# Patient Record
Sex: Male | Born: 1950 | Race: White | Hispanic: No | Marital: Married | Smoking: Former smoker
Health system: Southern US, Community
[De-identification: ages and names within clinical notes are randomized; demographics above are authoritative.]

## PROBLEM LIST (undated history)

## (undated) DIAGNOSIS — IMO0001 Reserved for inherently not codable concepts without codable children: Secondary | ICD-10-CM

## (undated) DIAGNOSIS — N529 Male erectile dysfunction, unspecified: Secondary | ICD-10-CM

## (undated) DIAGNOSIS — I517 Cardiomegaly: Secondary | ICD-10-CM

## (undated) DIAGNOSIS — Z0000 Encounter for general adult medical examination without abnormal findings: Secondary | ICD-10-CM

## (undated) DIAGNOSIS — N200 Calculus of kidney: Secondary | ICD-10-CM

## (undated) DIAGNOSIS — N320 Bladder-neck obstruction: Secondary | ICD-10-CM

## (undated) DIAGNOSIS — D649 Anemia, unspecified: Secondary | ICD-10-CM

## (undated) DIAGNOSIS — R310 Gross hematuria: Secondary | ICD-10-CM

## (undated) DIAGNOSIS — N400 Enlarged prostate without lower urinary tract symptoms: Secondary | ICD-10-CM

## (undated) DIAGNOSIS — N138 Other obstructive and reflux uropathy: Secondary | ICD-10-CM

## (undated) DIAGNOSIS — M1990 Unspecified osteoarthritis, unspecified site: Secondary | ICD-10-CM

## (undated) DIAGNOSIS — N401 Enlarged prostate with lower urinary tract symptoms: Principal | ICD-10-CM

## (undated) DIAGNOSIS — I10 Essential (primary) hypertension: Secondary | ICD-10-CM

## (undated) HISTORY — PX: LUMBAR LAMINECTOMY: SHX95

## (undated) HISTORY — DX: Benign prostatic hyperplasia without lower urinary tract symptoms: N40.0

## (undated) HISTORY — DX: Reserved for inherently not codable concepts without codable children: IMO0001

## (undated) HISTORY — DX: Unspecified osteoarthritis, unspecified site: M19.90

## (undated) HISTORY — DX: Encounter for general adult medical examination without abnormal findings: Z00.00

## (undated) HISTORY — DX: Benign prostatic hyperplasia with lower urinary tract symptoms: N40.1

## (undated) HISTORY — DX: Other obstructive and reflux uropathy: N13.8

## (undated) HISTORY — DX: Essential (primary) hypertension: I10

## (undated) HISTORY — DX: Cardiomegaly: I51.7

## (undated) HISTORY — PX: OTHER SURGICAL HISTORY: SHX169

## (undated) HISTORY — DX: Male erectile dysfunction, unspecified: N52.9

## (undated) HISTORY — DX: Calculus of kidney: N20.0

## (undated) HISTORY — DX: Gross hematuria: R31.0

## (undated) HISTORY — DX: Bladder-neck obstruction: N32.0

## (undated) HISTORY — DX: Anemia, unspecified: D64.9

## (undated) HISTORY — PX: MELANOMA EXCISION: SHX5266

---

## 2005-08-01 ENCOUNTER — Ambulatory Visit: Payer: Self-pay | Admitting: Gastroenterology

## 2009-01-29 ENCOUNTER — Ambulatory Visit: Payer: Self-pay | Admitting: Unknown Physician Specialty

## 2009-04-19 ENCOUNTER — Ambulatory Visit: Payer: Self-pay | Admitting: Specialist

## 2011-07-10 ENCOUNTER — Emergency Department: Payer: Self-pay | Admitting: Emergency Medicine

## 2011-07-10 LAB — COMPREHENSIVE METABOLIC PANEL
Anion Gap: 7 (ref 7–16)
Calcium, Total: 9 mg/dL (ref 8.5–10.1)
Chloride: 104 mmol/L (ref 98–107)
Co2: 28 mmol/L (ref 21–32)
EGFR (Non-African Amer.): >60
Osmolality: 280 (ref 275–301)
Potassium: 4.7 mmol/L (ref 3.5–5.1)
Sodium: 139 mmol/L (ref 136–145)

## 2011-07-10 LAB — CBC
HCT: 42.6 % (ref 40.0–52.0)
HGB: 14.1 g/dL (ref 13.0–18.0)
MCH: 26.5 pg (ref 26.0–34.0)
MCV: 80 fL (ref 80–100)
RDW: 14.6 % — ABNORMAL HIGH (ref 11.5–14.5)
WBC: 6.8 10*3/uL (ref 3.8–10.6)

## 2011-07-10 LAB — URINALYSIS, COMPLETE
Bacteria: NONE SEEN
Bilirubin,UR: NEGATIVE
Blood: NEGATIVE
Glucose,UR: NEGATIVE mg/dL (ref 0–75)
Ph: 6 (ref 4.5–8.0)
Specific Gravity: 1.006 (ref 1.003–1.030)
Squamous Epithelial: <1

## 2011-07-10 LAB — CK TOTAL AND CKMB (NOT AT ARMC)
CK, Total: 108 U/L (ref 35–232)
CK-MB: 1.2 ng/mL (ref 0.5–3.6)

## 2011-07-10 LAB — TSH: Thyroid Stimulating Horm: 2.86 u[IU]/mL

## 2011-08-29 ENCOUNTER — Ambulatory Visit: Payer: Self-pay | Admitting: Urology

## 2012-01-17 ENCOUNTER — Ambulatory Visit: Payer: Self-pay | Admitting: Internal Medicine

## 2012-06-26 ENCOUNTER — Other Ambulatory Visit: Payer: Self-pay

## 2012-11-11 ENCOUNTER — Ambulatory Visit: Payer: Self-pay | Admitting: Urology

## 2013-08-02 DIAGNOSIS — N138 Other obstructive and reflux uropathy: Secondary | ICD-10-CM

## 2013-08-02 DIAGNOSIS — N401 Enlarged prostate with lower urinary tract symptoms: Principal | ICD-10-CM

## 2013-08-02 DIAGNOSIS — I517 Cardiomegaly: Secondary | ICD-10-CM

## 2013-08-02 DIAGNOSIS — M1990 Unspecified osteoarthritis, unspecified site: Secondary | ICD-10-CM

## 2013-08-02 HISTORY — DX: Unspecified osteoarthritis, unspecified site: M19.90

## 2013-08-02 HISTORY — DX: Cardiomegaly: I51.7

## 2013-08-02 HISTORY — DX: Other obstructive and reflux uropathy: N13.8

## 2014-04-29 ENCOUNTER — Ambulatory Visit
Admit: 2014-04-29 | Disposition: A | Discharge status: Home / Self Care | Payer: Self-pay | Attending: Urology | Admitting: Urology

## 2014-06-08 ENCOUNTER — Other Ambulatory Visit: Payer: Self-pay | Admitting: Urology

## 2014-06-08 DIAGNOSIS — N400 Enlarged prostate without lower urinary tract symptoms: Secondary | ICD-10-CM

## 2014-06-08 MED ORDER — FINASTERIDE 5 MG PO TABS: 5.0000 mg | ORAL_TABLET | Freq: Every day | ORAL | Status: DC

## 2014-09-11 DIAGNOSIS — Z0000 Encounter for general adult medical examination without abnormal findings: Secondary | ICD-10-CM

## 2014-09-11 HISTORY — DX: Encounter for general adult medical examination without abnormal findings: Z00.00

## 2014-11-24 ENCOUNTER — Encounter: Payer: Self-pay | Admitting: Urology

## 2014-11-24 ENCOUNTER — Ambulatory Visit (INDEPENDENT_AMBULATORY_CARE_PROVIDER_SITE_OTHER): Payer: BC Managed Care – PPO | Admitting: Urology

## 2014-11-24 VITALS — BP 147/88 | HR 59 | Ht 71.0 in | Wt 166.7 lb

## 2014-11-24 DIAGNOSIS — N401 Enlarged prostate with lower urinary tract symptoms: Secondary | ICD-10-CM

## 2014-11-24 DIAGNOSIS — N5201 Erectile dysfunction due to arterial insufficiency: Secondary | ICD-10-CM

## 2014-11-24 DIAGNOSIS — N138 Other obstructive and reflux uropathy: Secondary | ICD-10-CM

## 2014-11-24 DIAGNOSIS — R310 Gross hematuria: Secondary | ICD-10-CM

## 2014-11-24 DIAGNOSIS — Z125 Encounter for screening for malignant neoplasm of prostate: Secondary | ICD-10-CM

## 2014-11-24 DIAGNOSIS — N529 Male erectile dysfunction, unspecified: Secondary | ICD-10-CM | POA: Insufficient documentation

## 2014-11-24 LAB — URINALYSIS, COMPLETE
Bilirubin, UA: NEGATIVE
Glucose, UA: NEGATIVE
Ketones, UA: NEGATIVE
LEUKOCYTES UA: NEGATIVE
Nitrite, UA: NEGATIVE
PH UA: 6 (ref 5.0–7.5)
Protein, UA: NEGATIVE
RBC, UA: NEGATIVE
Specific Gravity, UA: 1.02 (ref 1.005–1.030)
Urobilinogen, Ur: 0.2 mg/dL (ref 0.2–1.0)

## 2014-11-24 LAB — MICROSCOPIC EXAMINATION
BACTERIA UA: NONE SEEN
EPITHELIAL CELLS (NON RENAL): NONE SEEN /HPF (ref 0–10)
RBC MICROSCOPIC, UA: NONE SEEN /HPF (ref 0–?)

## 2014-11-24 LAB — BLADDER SCAN AMB NON-IMAGING

## 2014-11-24 NOTE — Progress Notes (Signed)
Bladder Scan Patient void: 53 ml Performed By: K.Russell,CMA 

## 2014-11-24 NOTE — Progress Notes (Signed)
11/24/2014 8:40 AM   Dionisio Paschal 28-Aug-1950 [id number]  Referring provider: No referring provider defined for this encounter.  Chief Complaint  Patient presents with  . Benign Prostatic Hypertrophy    uroflow, IPSS, PVR    HPI: 1 - Enlarged Prostate With Urinary hesitancy / Weak Stream - Long h/o mix of obsturctive and iritaitve sympotms. No on finasteride and tamsulosin with signifciant improvement, overal satisfied.   2 - Prostate Screening - No FHX prostate cancer 11/2014 DRE 40gm smooth/ PSA (1.97 (pre finasteride)  3 - Gross hematuria - eval 07/2014 with large prostate as likely cause, No masses.   4 - Erectile Dysfunction - long h/o difficulty achiieng and maintianing. Tride PDE5i but did not tollerate systemic symptoms. Libido preserved. Interested in trimix.   Today " Jeff Spencer" is seen in f/u above and PVR. No interval hematuria or urinary retention.    PMH: No past medical history on file.  Surgical History: No past surgical history on file.  Home Medications:    Medication List       This list is accurate as of: 11/24/14  8:40 AM.  Always use your most recent med list.               finasteride 5 MG tablet  Commonly known as:  PROSCAR  Take 1 tablet (5 mg total) by mouth daily.     naproxen 500 MG tablet  Commonly known as:  NAPROSYN  Take by mouth.     pantoprazole 40 MG tablet  Commonly known as:  PROTONIX  Take by mouth.     tamsulosin 0.4 MG Caps capsule  Commonly known as:  FLOMAX  TAKE ONE CAPSULE BY MOUTH EVERY DAY TAKE 30 MINUTES AFTER SAME MEAL EACH DAY        Allergies:  Allergies  Allergen Reactions  . Penicillin V Potassium Other (See Comments)  . Sulfa Antibiotics     Other reaction(s): Unknown    Family History: No family history on file.  Social History:  has no tobacco, alcohol, and drug history on file.  ROS:    Review of Systems  Gastrointestinal (upper)  : Negative for upper GI symptoms  Gastrointestinal  (lower) : Negative for lower GI symptoms  Constitutional : Negative for symptoms  Skin: Negative for skin symptoms  Eyes: Negative for eye symptoms  Ear/Nose/Throat : Negative for Ear/Nose/Throat symptoms  Hematologic/Lymphatic: Negative for Hematologic/Lymphatic symptoms  Cardiovascular : Negative for cardiovascular symptoms  Respiratory : Negative for respiratory symptoms  Endocrine: Negative for endocrine symptoms  Musculoskeletal: Negative for musculoskeletal symptoms  Neurological: Negative for neurological symptoms  Psychologic: Negative for psychiatric symptoms     Physical Exam: There were no vitals taken for this visit.  Constitutional:  Alert and oriented, No acute distress. HEENT: [place] AT, moist mucus membranes.  Trachea midline, no masses. Cardiovascular: No clubbing, cyanosis, or edema. Respiratory: Normal respiratory effort, no increased work of breathing. GI: Abdomen is soft, nontender, nondistended, no abdominal masses GU: No CVA tenderness. Phallus straight. No testes masses. DRE 40gm smooth Skin: No rashes, bruises or suspicious lesions. Lymph: No cervical or inguinal adenopathy. Neurologic: Grossly intact, no focal deficits, moving all 4 extremities. Psychiatric: Normal mood and affect.  Laboratory Data: Lab Results  Component Value Date   WBC 6.8 07/10/2011   HGB 14.1 07/10/2011   HCT 42.6 07/10/2011   MCV 80 07/10/2011   PLT 236 07/10/2011    Lab Results  Component Value Date   CREATININE 1.08  07/10/2011    No results found for: PSA  No results found for: TESTOSTERONE  No results found for: HGBA1C  Urinalysis    Component Value Date/Time   COLORURINE Straw 07/10/2011 1234   APPEARANCEUR Clear 07/10/2011 1234   LABSPEC 1.006 07/10/2011 1234   PHURINE 6.0 07/10/2011 1234   GLUCOSEU Negative 07/10/2011 1234   HGBUR Negative 07/10/2011 1234   BILIRUBINUR Negative 07/10/2011 1234   KETONESUR Negative 07/10/2011 1234    PROTEINUR Negative 07/10/2011 1234   NITRITE Negative 07/10/2011 1234   LEUKOCYTESUR Negative 07/10/2011 1234    Pertinent Imaging:  PVR " 56 mL"  Assessment & Plan:   1 - Enlarged Prostate With Urinary hesitancy / Weak Stream - Good response to  finasteride and tamsulosin, continue.   2 - Prostate Screening - up to date this year, continue annual screening.    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 Would need repeat eval for futrue gross episodes onliy.   4 - Erectile Dysfunction - discussed trimix, VED, MUSE, IPP and he wants to try trimix. RX and directions to custom care given.  5 - RTC few weeks for trimix teaching, then annualy.     No Follow-up on file.  Sebastian AcheMANNY, Daveena Elmore, MD  [facility] [address], [address] [place], [place] [address] [phone]

## 2014-12-07 ENCOUNTER — Telehealth: Payer: Self-pay | Admitting: Urology

## 2014-12-07 NOTE — Telephone Encounter (Signed)
Pharmacy called.  Pt went in to get prescription for Tri mix that was written on 11/24/14.  Pharmacist had a question, said Dr. Berneice HeinrichManny didn't write prescription correctly.  Please call [phone] dial ext [phone].

## 2014-12-08 NOTE — Telephone Encounter (Signed)
[facility] to [facility] Dr Emmaline LifeManny's Rx for TriMix.  I also LVM at patients home # letting him know that his Rx is now ready to be picked up.

## 2014-12-15 ENCOUNTER — Encounter: Payer: Self-pay | Admitting: Urology

## 2014-12-15 ENCOUNTER — Ambulatory Visit (INDEPENDENT_AMBULATORY_CARE_PROVIDER_SITE_OTHER): Payer: BC Managed Care – PPO | Admitting: Urology

## 2014-12-15 VITALS — BP 146/77 | HR 57 | Ht 71.0 in | Wt 167.4 lb

## 2014-12-15 DIAGNOSIS — N401 Enlarged prostate with lower urinary tract symptoms: Secondary | ICD-10-CM

## 2014-12-15 DIAGNOSIS — N5201 Erectile dysfunction due to arterial insufficiency: Secondary | ICD-10-CM

## 2014-12-15 DIAGNOSIS — R310 Gross hematuria: Secondary | ICD-10-CM | POA: Diagnosis not present

## 2014-12-15 DIAGNOSIS — N138 Other obstructive and reflux uropathy: Secondary | ICD-10-CM

## 2014-12-15 NOTE — Progress Notes (Signed)
8:36 AM   Dionisio PaschalDanny L Schimming [birth year] [id number]  Referring provider: Lauro RegulusMarshall W Anderson, MD [address] [facility] [place], [place] [address]  Chief Complaint  Patient presents with  . Follow-up    pt comes in today for a trimix injection and instructions on how to inject    HPI: 1 - Enlarged Prostate With Urinary hesitancy / Weak Stream - Long h/o mix of obsturctive and iritaitve sympotms. No on finasteride and tamsulosin with signifciant improvement, overal satisfied. PVR on meds "56mL" 2016.    2 - Prostate Screening - No FHX prostate cancer 11/2014 DRE 40gm smooth/ PSA (1.97 (pre finasteride)  3 - Gross hematuria - eval 07/2014 with large prostate as likely cause, No masses.   4 - Erectile Dysfunction - long h/o difficulty achiieng and maintianing. Tride PDE5i but did not tollerate systemic symptoms. Libido preserved. Trimix teachig performed 12/2014.   Today " Jeff Spencer" is seen to proceed with trimix teaching.    PMH: Past Medical History  Diagnosis Date  . Hemoglobin low   . Bladder neck contracture   . Kidney stone on left side   . Gross hematuria   . BPH (benign prostatic hyperplasia)   . Hypertension   . Frequency   . ED (erectile dysfunction)   . Left ventricular hypertrophy 08/02/2013    Last Assessment & Plan:  Bp is controlled and not sob.    . Arthritis, degenerative 08/02/2013  . Benign prostatic hyperplasia with urinary obstruction 08/02/2013    Last Assessment & Plan:  Urinary symptoms are doing well on flomax.    . Encounter for general adult medical examination without abnormal findings 09/11/2014    Overview:  Colonoscopy 07 and normal. Zostavax rx 2015, pneumovax 2016     Surgical History: Past Surgical History  Procedure Laterality Date  . Melanoma excision    . Lumbar laminectomy    . Pheochromocytoma excision      Home Medications:    Medication List       This list is accurate as of: 12/15/14  8:36 AM.  Always use your  most recent med list.               finasteride 5 MG tablet  Commonly known as:  PROSCAR  Take 1 tablet (5 mg total) by mouth daily.     naproxen 500 MG tablet  Commonly known as:  NAPROSYN  Take by mouth.     pantoprazole 40 MG tablet  Commonly known as:  PROTONIX  Take by mouth.     tamsulosin 0.4 MG Caps capsule  Commonly known as:  FLOMAX  TAKE ONE CAPSULE BY MOUTH EVERY DAY TAKE 30 MINUTES AFTER SAME MEAL EACH DAY        Allergies:  Allergies  Allergen Reactions  . Hydrocodone Hives  . Penicillin V Potassium Other (See Comments)  . Sulfa Antibiotics     Other reaction(s): Unknown    Family History: Family History  Problem Relation Age of Onset  . Kidney disease Neg Hx   . Aneurysm Father   . Bladder Cancer Neg Hx   . Prostate cancer Paternal Grandfather   . Heart disease Father   . Diabetes Mother   . Diabetes Father     Social History:  reports that he has quit smoking. He does not have any smokeless tobacco history on file. He reports that he drinks alcohol. He reports that he does not use illicit drugs.  ROS:    Review of Systems  Gastrointestinal (upper)  : Negative for upper GI symptoms  Gastrointestinal (lower) : Negative for lower GI symptoms  Constitutional : Negative for symptoms  Skin: Negative for skin symptoms  Eyes: Negative for eye symptoms  Ear/Nose/Throat : Negative for Ear/Nose/Throat symptoms  Hematologic/Lymphatic: Negative for Hematologic/Lymphatic symptoms  Cardiovascular : Negative for cardiovascular symptoms  Respiratory : Negative for respiratory symptoms  Endocrine: Negative for endocrine symptoms  Musculoskeletal: Negative for musculoskeletal symptoms  Neurological: Negative for neurological symptoms  Psychologic: Negative for psychiatric symptoms     Physical Exam: There were no vitals taken for this visit.  Constitutional:  Alert and oriented, No acute distress. HEENT: [place] AT, moist mucus  membranes.  Trachea midline, no masses. Cardiovascular: No clubbing, cyanosis, or edema. Respiratory: Normal respiratory effort, no increased work of breathing. GI: Abdomen is soft, nontender, nondistended, no abdominal masses GU: No CVA tenderness. Phallus straight. No testes masses. DRE 40gm smooth Skin: No rashes, bruises or suspicious lesions. Lymph: No cervical or inguinal adenopathy. Neurologic: Grossly intact, no focal deficits, moving all 4 extremities. Psychiatric: Normal mood and affect.  Laboratory Data: Lab Results  Component Value Date   WBC 6.8 07/10/2011   HGB 14.1 07/10/2011   HCT 42.6 07/10/2011   MCV 80 07/10/2011   PLT 236 07/10/2011    Lab Results  Component Value Date   CREATININE 1.08 07/10/2011    No results found for: PSA  No results found for: TESTOSTERONE  No results found for: HGBA1C  Urinalysis    Component Value Date/Time   COLORURINE Straw 07/10/2011 1234   APPEARANCEUR Clear 07/10/2011 1234   LABSPEC 1.006 07/10/2011 1234   PHURINE 6.0 07/10/2011 1234   GLUCOSEU Negative 11/24/2014 0849   GLUCOSEU Negative 07/10/2011 1234   HGBUR Negative 07/10/2011 1234   BILIRUBINUR Negative 11/24/2014 0849   BILIRUBINUR Negative 07/10/2011 1234   KETONESUR Negative 07/10/2011 1234   PROTEINUR Negative 07/10/2011 1234   NITRITE Negative 11/24/2014 0849   NITRITE Negative 07/10/2011 1234   LEUKOCYTESUR Negative 11/24/2014 0849   LEUKOCYTESUR Negative 07/10/2011 1234    CORPORAL BODY INJECTION PROCEDURE:  We discussed physiology of trimix, injection technique, proper storage, alternating injeciton sites. We discussed dose-titration phase and risk of priapism and need to go to ER / call MD for painful erection lasting longer than 4 or more hours.   I then administered 0.2cc of trimix into penis using aseptic technique. I rechecked the patient 10 minutes later and he has proper response w/o hematoma.    Assessment & Plan:   1 - Enlarged Prostate  With Urinary hesitancy / Weak Stream - Good response to  finasteride and tamsulosin, continue.   2 - Prostate Screening - up to date this year, continue annual screening.    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 Would need repeat eval for futrue gross episodes onliy.   4 - Erectile Dysfunction - s/p trimix teaching as per above.   5 - RTC 1 year with PSA prior.     No Follow-up on file.  Sebastian Ache, MD  [facility] [address] [place], [place] [address] [phone]

## 2015-05-26 DIAGNOSIS — H02834 Dermatochalasis of left upper eyelid: Secondary | ICD-10-CM | POA: Insufficient documentation

## 2015-05-26 DIAGNOSIS — H02836 Dermatochalasis of left eye, unspecified eyelid: Secondary | ICD-10-CM

## 2015-05-26 DIAGNOSIS — H57819 Brow ptosis, unspecified: Secondary | ICD-10-CM | POA: Insufficient documentation

## 2015-05-26 DIAGNOSIS — H02833 Dermatochalasis of right eye, unspecified eyelid: Secondary | ICD-10-CM | POA: Insufficient documentation

## 2015-12-14 ENCOUNTER — Ambulatory Visit (INDEPENDENT_AMBULATORY_CARE_PROVIDER_SITE_OTHER): Payer: BC Managed Care – PPO | Admitting: Urology

## 2015-12-14 ENCOUNTER — Encounter: Payer: Self-pay | Admitting: Urology

## 2015-12-14 VITALS — BP 130/83 | HR 59 | Ht 70.0 in | Wt 173.0 lb

## 2015-12-14 DIAGNOSIS — N5201 Erectile dysfunction due to arterial insufficiency: Secondary | ICD-10-CM | POA: Diagnosis not present

## 2015-12-14 DIAGNOSIS — N486 Induration penis plastica: Secondary | ICD-10-CM | POA: Diagnosis not present

## 2015-12-14 DIAGNOSIS — R350 Frequency of micturition: Secondary | ICD-10-CM | POA: Diagnosis not present

## 2015-12-14 DIAGNOSIS — N138 Other obstructive and reflux uropathy: Secondary | ICD-10-CM

## 2015-12-14 DIAGNOSIS — N401 Enlarged prostate with lower urinary tract symptoms: Secondary | ICD-10-CM | POA: Diagnosis not present

## 2015-12-14 DIAGNOSIS — R310 Gross hematuria: Secondary | ICD-10-CM

## 2015-12-14 LAB — URINALYSIS, COMPLETE
BILIRUBIN UA: NEGATIVE
GLUCOSE, UA: NEGATIVE
Ketones, UA: NEGATIVE
Leukocytes, UA: NEGATIVE
Nitrite, UA: NEGATIVE
RBC, UA: NEGATIVE
SPEC GRAV UA: 1.02 (ref 1.005–1.030)
Urobilinogen, Ur: 0.2 mg/dL (ref 0.2–1.0)
pH, UA: 7 (ref 5.0–7.5)

## 2015-12-14 LAB — MICROSCOPIC EXAMINATION: BACTERIA UA: NONE SEEN

## 2015-12-14 MED ORDER — FINASTERIDE 5 MG PO TABS: 5.0000 mg | ORAL_TABLET | Freq: Every day | ORAL | 3 refills | Status: DC

## 2015-12-14 MED ORDER — TAMSULOSIN HCL 0.4 MG PO CAPS: 0.4000 mg | ORAL_CAPSULE | Freq: Every day | ORAL | 3 refills | Status: DC

## 2015-12-14 NOTE — Progress Notes (Signed)
5:46 AM   Dionisio PaschalDanny L Banet [birth year] [id number]  Referring provider: Lauro RegulusMarshall W Anderson, MD [address] [facility] [place] [place] [address]  No chief complaint on file.   HPI: 1 - Enlarged Prostate With Urinary hesitancy / Weak Stream - Long h/o mix of obsturctive and iritaitve sympotms. No on finasteride and tamsulosin with signifciant improvement, overal satisfied. PVR on meds "56mL" 2016.    2 - Prostate Screening - No FHX prostate cancer 11/2014 DRE 40gm smooth/ PSA (1.97 (pre finasteride) 12/2015 DRE 40gm smooth / PSA (today / pending on finasteride)  3 - Gross hematuria - eval 07/2014 with large prostate as likely cause, No masses.   4 - Erectile Dysfunction - long h/o difficulty achiieng and maintianing. Tride PDE5i but did not tollerate systemic symptoms. Libido preserved. Trimix teachig performed 12/2014. Meets goals but admits to infreqeunt use.   5 - Peyronie's Disease - abotu 45 degrees dorsal curvature stable x years. Able to penetrate but with some difficulty mostly from hinging.   Today " Jeff Spencer" is seen in f/u above. No interval gross hematuria.    PMH: Past Medical History:  Diagnosis Date  . Arthritis, degenerative 08/02/2013  . Benign prostatic hyperplasia with urinary obstruction 08/02/2013   Last Assessment & Plan:  Urinary symptoms are doing well on flomax.    . Bladder neck contracture   . BPH (benign prostatic hyperplasia)   . ED (erectile dysfunction)   . Encounter for general adult medical examination without abnormal findings 09/11/2014   Overview:  Colonoscopy 07 and normal. Zostavax rx 2015, pneumovax 2016   . Frequency   . Gross hematuria   . Hemoglobin low   . Hypertension   . Kidney stone on left side   . Left ventricular hypertrophy 08/02/2013   Last Assessment & Plan:  Bp is controlled and not sob.      Surgical History: Past Surgical History:  Procedure Laterality Date  . LUMBAR LAMINECTOMY    . MELANOMA EXCISION      . Pheochromocytoma Excision      Home Medications:    Medication List       Accurate as of 12/14/15  5:46 AM. Always use your most recent med list.          finasteride 5 MG tablet Commonly known as:  PROSCAR Take 1 tablet (5 mg total) by mouth daily.   naproxen 500 MG tablet Commonly known as:  NAPROSYN Take by mouth.   pantoprazole 40 MG tablet Commonly known as:  PROTONIX Take by mouth.   tamsulosin 0.4 MG Caps capsule Commonly known as:  FLOMAX TAKE ONE CAPSULE BY MOUTH EVERY DAY TAKE 30 MINUTES AFTER SAME MEAL EACH DAY       Allergies:  Allergies  Allergen Reactions  . Hydrocodone Hives  . Penicillin V Potassium Other (See Comments)  . Sulfa Antibiotics     Other reaction(s): Unknown    Family History: Family History  Problem Relation Age of Onset  . Kidney disease Neg Hx   . Aneurysm Father   . Bladder Cancer Neg Hx   . Prostate cancer Paternal Grandfather   . Heart disease Father   . Diabetes Mother   . Diabetes Father     Social History:  reports that he has quit smoking. He does not have any smokeless tobacco history on file. He reports that he drinks alcohol. He reports that he does not use drugs.  ROS:  Review of Systems  Gastrointestinal (upper)  : Negative for upper GI symptoms  Gastrointestinal (lower) : Negative for lower GI symptoms  Constitutional : Negative for symptoms  Skin: Negative for skin symptoms  Eyes: Negative for eye symptoms  Ear/Nose/Throat : Negative for Ear/Nose/Throat symptoms  Hematologic/Lymphatic: Negative for Hematologic/Lymphatic symptoms  Cardiovascular : Negative for cardiovascular symptoms  Respiratory : Negative for respiratory symptoms  Endocrine: Negative for endocrine symptoms  Musculoskeletal: Negative for musculoskeletal symptoms  Neurological: Negative for neurological symptoms  Psychologic: Negative for psychiatric symptoms     Physical Exam: There were no vitals  taken for this visit.  Constitutional:  Alert and oriented, No acute distress. HEENT:  AT, moist mucus membranes.  Trachea midline, no masses. Cardiovascular: No clubbing, cyanosis, or edema. Respiratory: Normal respiratory effort, no increased work of breathing. GI: Abdomen is soft, nontender, nondistended, no abdominal masses GU: No CVA tenderness. Phallus straight. No testes masses. DRE 40gm smooth Skin: No rashes, bruises or suspicious lesions. Lymph: No cervical or inguinal adenopathy. Neurologic: Grossly intact, no focal deficits, moving all 4 extremities. Psychiatric: Normal mood and affect.  Laboratory Data: Lab Results  Component Value Date   WBC 6.8 07/10/2011   HGB 14.1 07/10/2011   HCT 42.6 07/10/2011   MCV 80 07/10/2011   PLT 236 07/10/2011    Lab Results  Component Value Date   CREATININE 1.08 07/10/2011    No results found for: PSA  No results found for: TESTOSTERONE  No results found for: HGBA1C  Urinalysis    Component Value Date/Time   COLORURINE Straw 07/10/2011 1234   APPEARANCEUR Clear 11/24/2014 0849   LABSPEC 1.006 07/10/2011 1234   PHURINE 6.0 07/10/2011 1234   GLUCOSEU Negative 11/24/2014 0849   GLUCOSEU Negative 07/10/2011 1234   HGBUR Negative 07/10/2011 1234   BILIRUBINUR Negative 11/24/2014 0849   BILIRUBINUR Negative 07/10/2011 1234   KETONESUR Negative 07/10/2011 1234   PROTEINUR Negative 11/24/2014 0849   PROTEINUR Negative 07/10/2011 1234   NITRITE Negative 11/24/2014 0849   NITRITE Negative 07/10/2011 1234   LEUKOCYTESUR Negative 11/24/2014 0849   LEUKOCYTESUR Negative 07/10/2011 1234      Assessment & Plan:   1 - Enlarged Prostate With Urinary hesitancy / Weak Stream - Good response to  finasteride and tamsulosin, continue.   2 - Prostate Screening - up to date this year, continue annual screening.    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 Would need repeat eval for futrue gross episodes onliy.   4 -  Erectile Dysfunction - stable on trimix, continue. Reinforced that this is vascular disease and rec heart healthy diet / lifestyle to slow progression.   5 - Peyronie's Disease - discussed options observation, xiaflex, prosthesis. He opts for observation. Would strongly consider prosthesis should this become limiting as will treat curvature and ED both.   5 - RTC 1 year with PSA prior.     No Follow-up on file.  Sebastian AcheMANNY, Christen Wardrop, MD  [facility] [address], [address] [place], [place] [address] [phone]) [phone]

## 2015-12-15 LAB — PSA: Prostate Specific Ag, Serum: 1.2 ng/mL (ref 0.0–4.0)

## 2016-12-11 ENCOUNTER — Other Ambulatory Visit: Payer: BC Managed Care – PPO

## 2016-12-13 ENCOUNTER — Ambulatory Visit: Payer: BC Managed Care – PPO | Admitting: Urology

## 2016-12-13 ENCOUNTER — Other Ambulatory Visit: Payer: BC Managed Care – PPO

## 2016-12-13 DIAGNOSIS — N401 Enlarged prostate with lower urinary tract symptoms: Secondary | ICD-10-CM

## 2016-12-13 DIAGNOSIS — R350 Frequency of micturition: Principal | ICD-10-CM

## 2016-12-14 LAB — PSA: PROSTATE SPECIFIC AG, SERUM: 1 ng/mL (ref 0.0–4.0)

## 2016-12-14 LAB — SPECIMEN STATUS REPORT

## 2016-12-21 ENCOUNTER — Encounter: Payer: Self-pay | Admitting: Urology

## 2016-12-21 ENCOUNTER — Ambulatory Visit: Payer: BC Managed Care – PPO | Admitting: Urology

## 2016-12-21 VITALS — BP 132/75 | HR 69 | Ht 71.0 in | Wt 170.0 lb

## 2016-12-21 DIAGNOSIS — N529 Male erectile dysfunction, unspecified: Secondary | ICD-10-CM

## 2016-12-21 DIAGNOSIS — N401 Enlarged prostate with lower urinary tract symptoms: Secondary | ICD-10-CM | POA: Diagnosis not present

## 2016-12-21 NOTE — Progress Notes (Signed)
----------------------------- for  12/21/2016 8:40 AM   Jeff Spencer 01-21-1950 [id number]  Referring provider: Lauro RegulusAnderson, Marshall W, MD [address] [facility] [place], [place] [id number]  Chief Complaint  Patient presents with  . Benign Prostatic Hypertrophy    1year   Urologic problem list:  1 - Enlarged Prostate With Urinary hesitancy / Weak Stream - Long h/o mix of obsturctive and iritaitve sympotms. No on finasteride and tamsulosin with signifciant improvement, overal satisfied. PVR on meds "56mL" 2016.    2 - Prostate Screening - No FHX prostate cancer 11/2014 DRE 40gm smooth/ PSA (1.97 (pre finasteride) 12/2015 DRE 40gm smooth / PSA (today / pending on finasteride)  3 - Gross hematuria - eval 07/2014 with large prostate as likely cause, No masses.   4 - Erectile Dysfunction - long h/o difficulty achiieng and maintianing. Tride PDE5i but did not tollerate systemic symptoms. Libido preserved. Trimix teachig performed 12/2014. Meets goals but admits to infreqeunt use.   5 - Peyronie's Disease - abotu 45 degrees dorsal curvature stable x years. Able to penetrate but with some difficulty mostly from hinging.    HPI: 66 year old male presents for annual follow-up.  He remains on tamsulosin and finasteride and has stable lower urinary tract symptoms.  He has occasional postvoid dribbling and urinary frequency.  He voids with a good stream.  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  He denies flank, abdominal, pelvic or scrotal pain.  He has used tri-mix for ED however states his medication did not get refilled last year and has requested a refill.  He has a history of Peyronie's disease and states his curvature has actually improved in the past year.  Uncorrected PSA drawn 12/13/2016 was 1.0.   PMH: Past Medical History:  Diagnosis Date  . Arthritis, degenerative  08/02/2013  . Benign prostatic hyperplasia with urinary obstruction 08/02/2013   Last Assessment & Plan:  Urinary symptoms are doing well on flomax.    . Bladder neck contracture   . BPH (benign prostatic hyperplasia)   . ED (erectile dysfunction)   . Encounter for general adult medical examination without abnormal findings 09/11/2014   Overview:  Colonoscopy 07 and normal. Zostavax rx 2015, pneumovax 2016   . Frequency   . Gross hematuria   . Hemoglobin low   . Hypertension   . Kidney stone on left side   . Left ventricular hypertrophy 08/02/2013   Last Assessment & Plan:  Bp is controlled and not sob.      Surgical History: . Left adrenalectomy 1980  . Lumbar disk surgery 1989  . Septoplasty and sinus surgery   Home Medications:  Allergies as of 12/21/2016      Reactions   Hydrocodone Hives, Itching   Penicillin V Potassium Other (See Comments)   Sulfa Antibiotics    Other reaction(s): Unknown      Medication List        Accurate as of 12/21/16  8:40 AM. Always use your most recent med list.          finasteride 5 MG tablet Commonly known as:  PROSCAR Take 1 tablet (5 mg total) by mouth daily.   naproxen 500 MG tablet Commonly known as:  NAPROSYN Take by mouth.   pantoprazole 40 MG tablet Commonly known as:  PROTONIX Take by mouth.   tamsulosin 0.4 MG Caps capsule Commonly known as:  FLOMAX Take  1 capsule (0.4 mg total) by mouth daily.       Allergies:  Allergies  Allergen Reactions  . Hydrocodone Hives and Itching  . Penicillin V Potassium Other (See Comments)  . Sulfa Antibiotics     Other reaction(s): Unknown    Family History: Family History  Problem Relation Age of Onset  . Aneurysm Father   . Heart disease Father   . Diabetes Father   . Prostate cancer Paternal Grandfather   . Diabetes Mother   . Kidney disease Neg Hx   . Bladder Cancer Neg Hx     Social History:  reports that he has quit smoking. he has never used smokeless tobacco. He  reports that he drinks alcohol. He reports that he does not use drugs.  ROS: UROLOGY Frequent Urination?: Yes Hard to postpone urination?: No Burning/pain with urination?: No Get up at night to urinate?: Yes Leakage of urine?: Yes Urine stream starts and stops?: No Trouble starting stream?: No Do you have to strain to urinate?: No Blood in urine?: No Urinary tract infection?: No Sexually transmitted disease?: No Injury to kidneys or bladder?: No Painful intercourse?: No Weak stream?: No Erection problems?: Yes Penile pain?: No  Gastrointestinal Nausea?: No Vomiting?: No Indigestion/heartburn?: No Diarrhea?: No Constipation?: No  Constitutional Fever: No Night sweats?: No Weight loss?: No Fatigue?: No  Skin Skin rash/lesions?: No Itching?: No  Eyes Blurred vision?: No Double vision?: No  Ears/Nose/Throat Sore throat?: No Sinus problems?: No  Hematologic/Lymphatic Swollen glands?: No Easy bruising?: No  Cardiovascular Leg swelling?: No Chest pain?: No  Respiratory Cough?: No Shortness of breath?: No  Endocrine Excessive thirst?: No  Musculoskeletal Back pain?: No Joint pain?: No  Neurological Headaches?: No Dizziness?: No  Psychologic Depression?: No Anxiety?: No  Physical Exam: BP 132/75   Pulse 69   Ht 5\' 11"  (1.803 m)   Wt 170 lb (77.1 kg)   BMI 23.71 kg/m   Constitutional:  Alert and oriented, No acute distress. HEENT: [place] AT, moist mucus membranes.  Trachea midline, no masses. Cardiovascular: No clubbing, cyanosis, or edema. Respiratory: Normal respiratory effort, no increased work of breathing. GI: Abdomen is soft, nontender, nondistended, no abdominal masses GU: No CVA tenderness.  Prostate 50 g, smooth without nodules Skin: No rashes, bruises or suspicious lesions. Lymph: No cervical or inguinal adenopathy. Neurologic: Grossly intact, no focal deficits, moving all 4 extremities. Psychiatric: Normal mood and  affect.  Laboratory Data: Lab Results  Component Value Date   WBC 6.8 07/10/2011   HGB 14.1 07/10/2011   HCT 42.6 07/10/2011   MCV 80 07/10/2011   PLT 236 07/10/2011    Lab Results  Component Value Date   CREATININE 1.08 07/10/2011    Lab Results  Component Value Date   PSA1 1.0 12/13/2016   PSA1 1.2 12/14/2015    Assessment & Plan:   1. Benign prostatic hyperplasia with lower urinary tract symptoms, symptom details unspecified Stable.  Continue tamsulosin and finasteride.  Continue annual follow-up.  He was instructed to call back for recurrent hematuria  2. Erectile dysfunction, unspecified erectile dysfunction type Stable.  Tri-mix will be refilled    Riki AltesScott C Takelia Urieta, MD  [facility] [address], [address] [place], [place] [address] [phone]

## 2016-12-22 ENCOUNTER — Telehealth: Payer: Self-pay

## 2016-12-22 NOTE — Telephone Encounter (Signed)
Medication refilled

## 2016-12-22 NOTE — Telephone Encounter (Signed)
-----   Message from Riki AltesScott C Stoioff, MD sent at 12/21/2016  8:47 AM EST ----- Please refill [facility] at [facility]

## 2016-12-25 IMAGING — CT CT ABDOMEN AND PELVIS WITHOUT AND WITH CONTRAST
2 of 10 series · 10 of 46 positions shown, 16 images · IV contrast (omnipaque)
Comparison: 11/11/2012

CLINICAL DATA: Gross hematuria

EXAM:
CT ABDOMEN AND PELVIS WITHOUT AND WITH CONTRAST
TECHNIQUE: Multidetector CT imaging of the abdomen and pelvis was performed
following the standard protocol before and following the bolus
administration of intravenous contrast.
CONTRAST:  125 cc of Omnipaque 300

[Series 5: cor hematuria > 45 wo · coronal · 0.90mm/px · 2 of 128 slices shown, 3 images]
[im 43/128  soft-tissue]
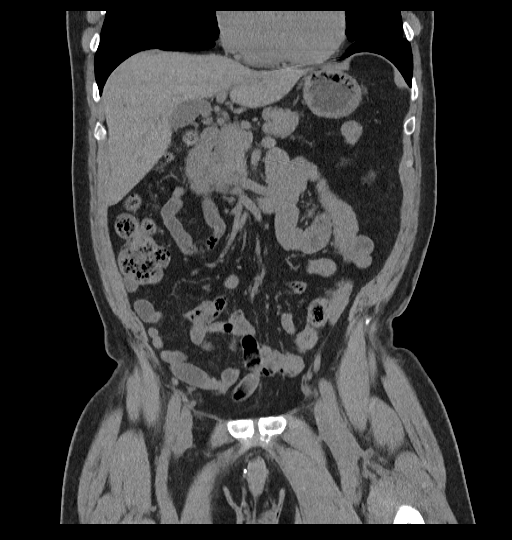
[im 43/128  bone]
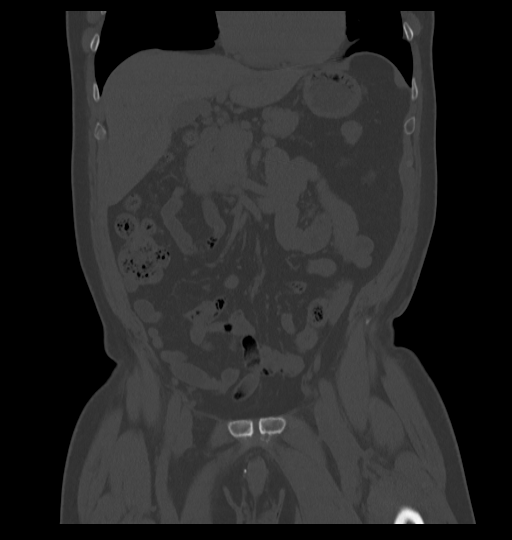
[im 85/128  soft-tissue]
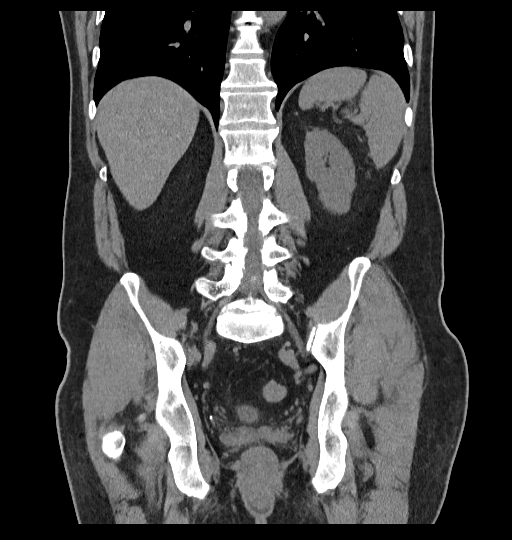

[Series 12: delay · axial · delayed · 0.78mm/px · z∈[-1165,-800]mm · 8 of 95 slices shown, 13 images]
[im 11/95  soft-tissue]
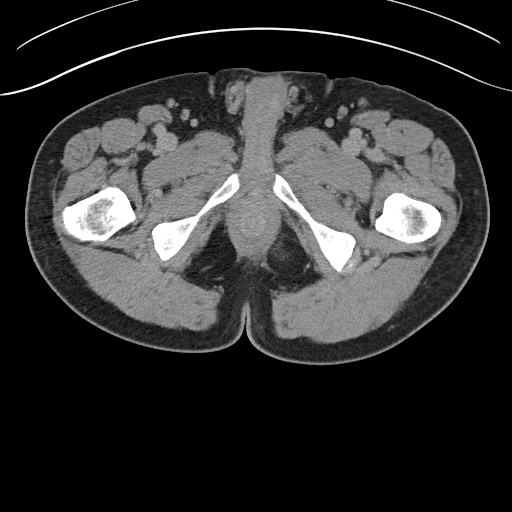
[im 11/95  bone]
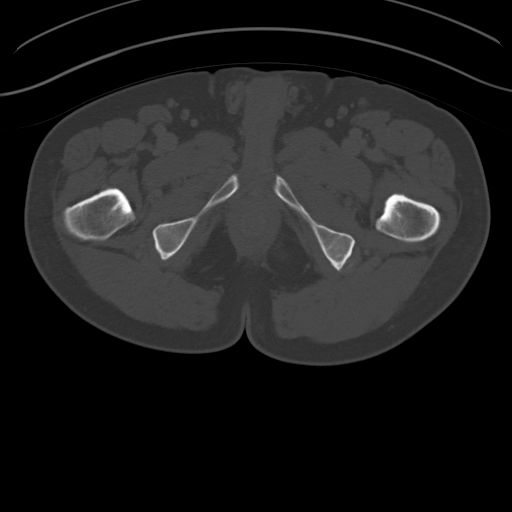
[im 21/95  soft-tissue]
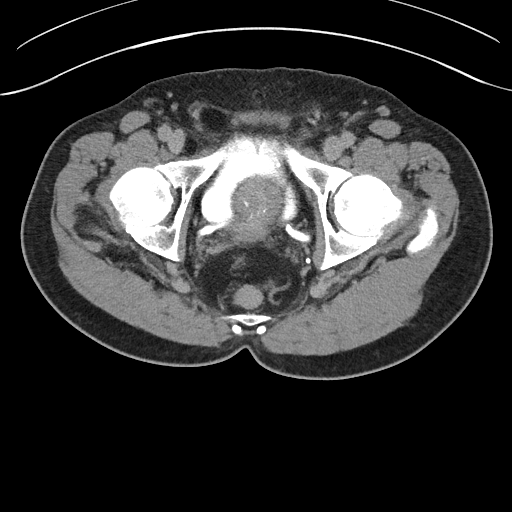
[im 32/95  soft-tissue]
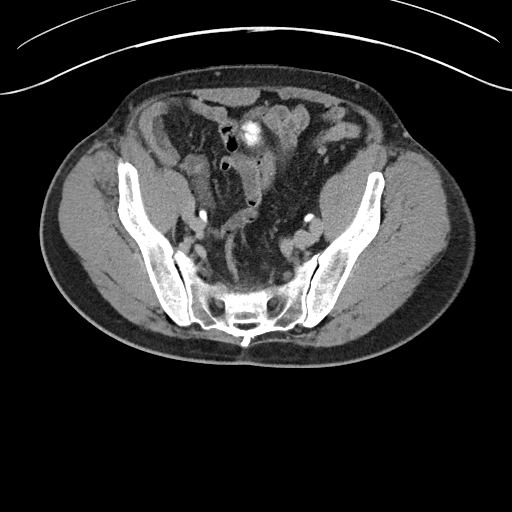
[im 42/95  soft-tissue]
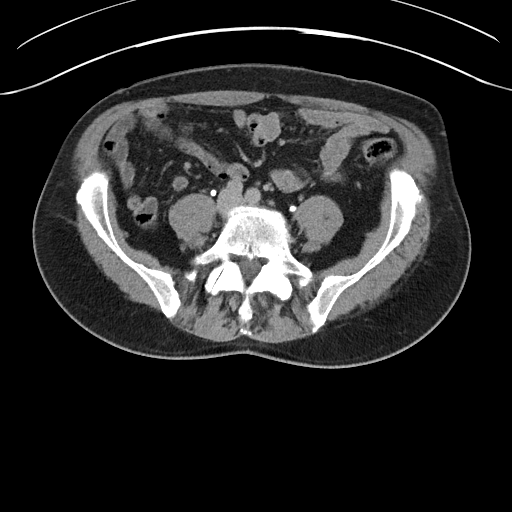
[im 53/95  soft-tissue]
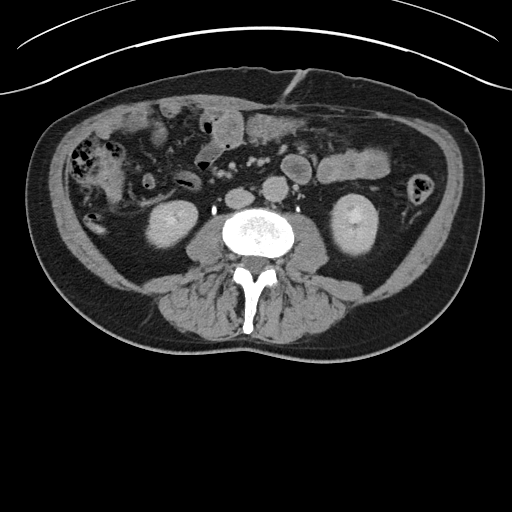
[im 53/95  lung]
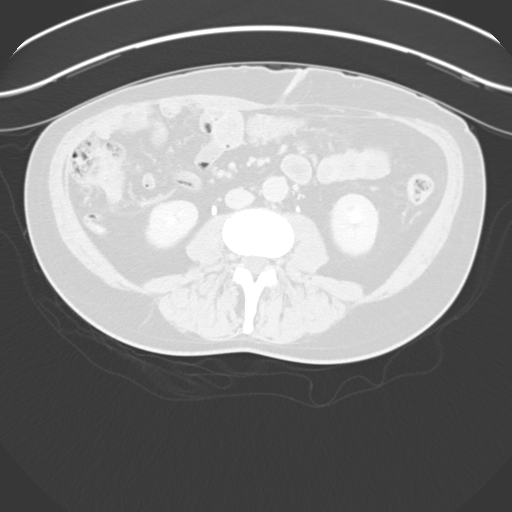
[im 63/95  soft-tissue]
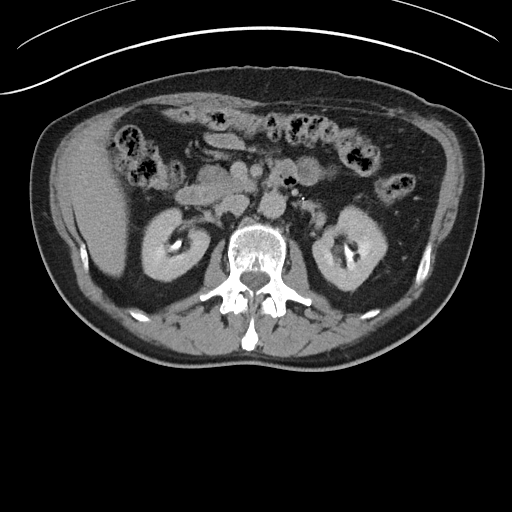
[im 63/95  lung]
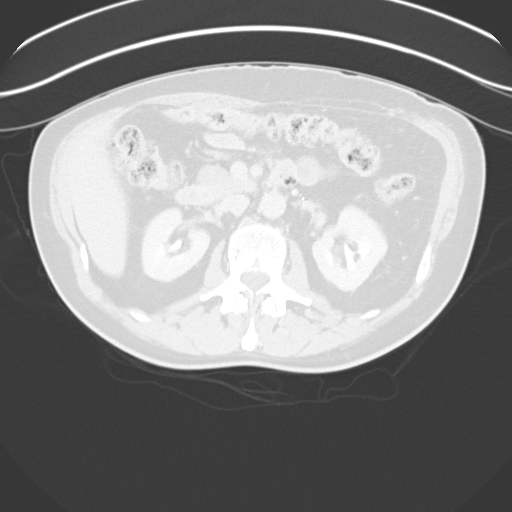
[im 74/95  soft-tissue]
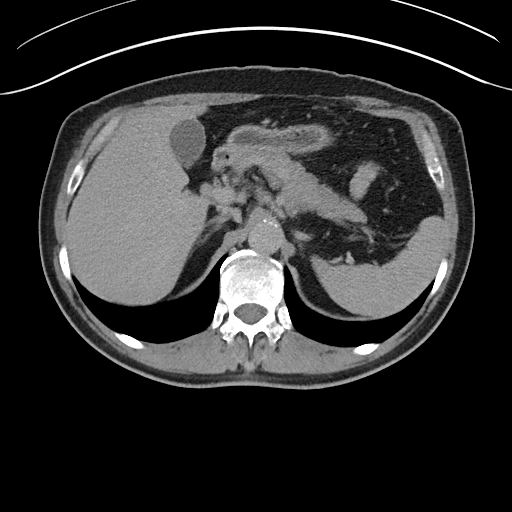
[im 74/95  lung]
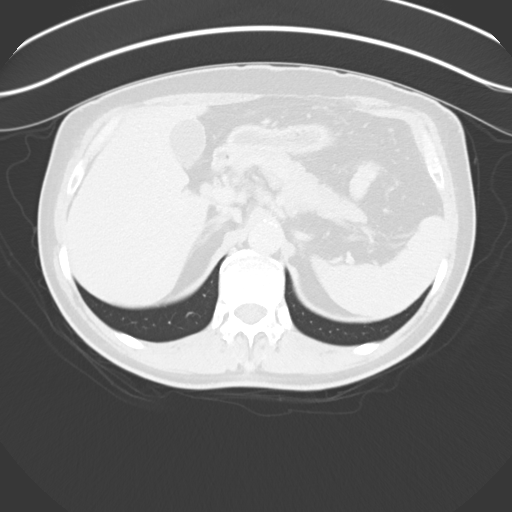
[im 84/95  soft-tissue]
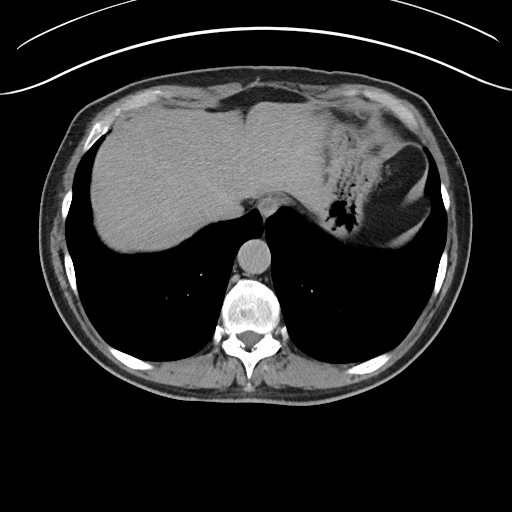
[im 84/95  lung]
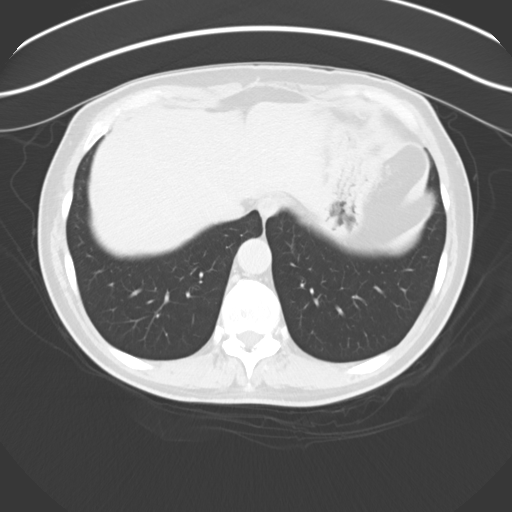

[10 of 46 positions shown; findings below may reference images not displayed]

FINDINGS: Lower chest: No pleural effusion identified. No pericardial
effusion. The lung bases appear clear.

Hepatobiliary: Low-attenuation structure along the dome of liver is
unchanged measuring 5 mm. No suspicious liver abnormality
identified. The gallbladder appears normal. No biliary dilatation.

Pancreas: Normal appearance of the pancreas.

Spleen: Negative.

Adrenals/Urinary Tract: The adrenal glands are both within normal
limits. There is a stone within the lower pole collecting system of
the left kidney which measures 4 mm, image 70/series 5. Left
parapelvic cyst is identified measuring 3.4 cm the ureters are
patent. The urinary bladder is on unremarkable.

Stomach/Bowel: The stomach and small bowel loops are normal. The
appendix is visualized and appears normal.

Vascular/Lymphatic: Calcified atherosclerotic disease involves the
abdominal aorta. No aneurysm. There are surgical clips identified
within the left periaortic region. No retroperitoneal or mesenteric
adenopathy. No pelvic or inguinal adenopathy.

Reproductive: Prostate gland appears enlarged and has mass effect
upon the base of bladder.

Other: There is no ascites or focal fluid collections within the
abdomen or pelvis.

Musculoskeletal: Review of the visualized osseous structures is
significant for multi level degenerative disc disease. This is most
advanced at the L4-5 level. No aggressive lytic or sclerotic bone
lesions.
IMPRESSION: 1. Nonobstructing left renal calculus.
2. Left kidney renal sinus cyst
3. Postoperative changes within the left periaortic region noted.
This appears stable from previous exam.
4. Prostate gland enlargement.

## 2017-12-20 ENCOUNTER — Ambulatory Visit: Payer: BC Managed Care – PPO | Admitting: Urology

## 2017-12-20 ENCOUNTER — Other Ambulatory Visit: Payer: Self-pay

## 2017-12-20 ENCOUNTER — Encounter: Payer: Self-pay | Admitting: Urology

## 2017-12-20 VITALS — BP 155/90 | HR 59 | Wt 177.0 lb

## 2017-12-20 DIAGNOSIS — N5201 Erectile dysfunction due to arterial insufficiency: Secondary | ICD-10-CM

## 2017-12-20 DIAGNOSIS — N401 Enlarged prostate with lower urinary tract symptoms: Secondary | ICD-10-CM

## 2017-12-20 DIAGNOSIS — R350 Frequency of micturition: Secondary | ICD-10-CM

## 2017-12-20 LAB — BLADDER SCAN AMB NON-IMAGING

## 2017-12-20 NOTE — Progress Notes (Signed)
12/20/2017 9:59 AM   Jeff Spencer Aug 22, 1950 [id number]  Referring provider: Lauro RegulusAnderson, Marshall W, MD [address] [facility] [place], [place] [address]  Chief Complaint  Patient presents with  . Benign Prostatic Hypertrophy    Urologic history:  1 -BPH with lower urinary tract symptoms Combination therapy finasteride/tamsulosin  2 -history of gross hematuria eval 07/2014 with large prostate as likely cause   3 - Erectile Dysfunction Uses Trimix periodically  5 - Peyronie's Disease No significant issues   HPI: 67 year old male presents for annual follow-up.  He has stable lower urinary tract symptoms with his most bothersome symptoms being decreased force and caliber of urinary stream.  IPSS completed today was 10/35 with a quality of life rated 2/6  Denies dysuria, gross hematuria or flank/abdominal/pelvic/scrotal pain.  He uses Trimix on occasions with good efficacy.   PMH: Past Medical History:  Diagnosis Date  . Arthritis, degenerative 08/02/2013  . Benign prostatic hyperplasia with urinary obstruction 08/02/2013   Last Assessment & Plan:  Urinary symptoms are doing well on flomax.    . Bladder neck contracture   . BPH (benign prostatic hyperplasia)   . ED (erectile dysfunction)   . Encounter for general adult medical examination without abnormal findings 09/11/2014   Overview:  Colonoscopy 07 and normal. Zostavax rx 2015, pneumovax 2016   . Frequency   . Gross hematuria   . Hemoglobin low   . Hypertension   . Kidney stone on left side   . Left ventricular hypertrophy 08/02/2013   Last Assessment & Plan:  Bp is controlled and not sob.      Surgical History: Past Surgical History:  Procedure Laterality Date  . LUMBAR LAMINECTOMY    . MELANOMA EXCISION    . Pheochromocytoma Excision      Home Medications:  Allergies as of 12/20/2017      Reactions   Hydrocodone Hives, Itching   Penicillin V Potassium Other (See Comments)   Sulfa Antibiotics    Other reaction(s): Unknown      Medication List       Accurate as of December 20, 2017  9:59 AM. Always use your most recent med list.        finasteride 5 MG tablet Commonly known as:  PROSCAR Take 1 tablet (5 mg total) by mouth daily.   naproxen 500 MG tablet Commonly known as:  NAPROSYN Take by mouth.   pantoprazole 40 MG tablet Commonly known as:  PROTONIX Take by mouth.   tamsulosin 0.4 MG Caps capsule Commonly known as:  FLOMAX Take 1 capsule (0.4 mg total) by mouth daily.       Allergies:  Allergies  Allergen Reactions  . Hydrocodone Hives and Itching  . Penicillin V Potassium Other (See Comments)  . Sulfa Antibiotics     Other reaction(s): Unknown    Family History: Family History  Problem Relation Age of Onset  . Aneurysm Father   . Heart disease Father   . Diabetes Father   . Prostate cancer Paternal Grandfather   . Diabetes Mother   . Kidney disease Neg Hx   . Bladder Cancer Neg Hx     Social History:  reports that he has quit smoking. He has never used smokeless tobacco. He reports current alcohol use. He reports that he does not use drugs.  ROS: UROLOGY Frequent Urination?: Yes Hard to postpone urination?: No Burning/pain with urination?: No Get up at night to urinate?: No Leakage of urine?:  No Urine stream starts and stops?: No Trouble starting stream?: No Do you have to strain to urinate?: No Blood in urine?: No Urinary tract infection?: No Sexually transmitted disease?: No Injury to kidneys or bladder?: No Painful intercourse?: No Weak stream?: No Erection problems?: No Penile pain?: No  Gastrointestinal Nausea?: No Vomiting?: No Indigestion/heartburn?: No Diarrhea?: No Constipation?: No  Constitutional Fever: No Night sweats?: No Weight loss?: No Fatigue?: No  Skin Skin rash/lesions?: No Itching?: No  Eyes Blurred vision?: No Double vision?: No  Ears/Nose/Throat Sore throat?: No Sinus  problems?: No  Hematologic/Lymphatic Swollen glands?: No Easy bruising?: No  Cardiovascular Leg swelling?: No Chest pain?: No  Respiratory Cough?: No Shortness of breath?: No  Endocrine Excessive thirst?: No  Musculoskeletal Back pain?: No Joint pain?: No  Neurological Headaches?: No Dizziness?: No  Psychologic Depression?: No Anxiety?: No  Physical Exam: BP (!) 155/90   Pulse (!) 59   Wt 177 lb (80.3 kg)   BMI 24.69 kg/m   Constitutional:  Alert and oriented, No acute distress. HEENT: [place] AT, moist mucus membranes.  Trachea midline, no masses. Cardiovascular: No clubbing, cyanosis, or edema. Respiratory: Normal respiratory effort, no increased work of breathing. GI: Abdomen is soft, nontender, nondistended, no abdominal masses GU: No CVA tenderness.  Prostate 50 g, smooth without nodules Lymph: No cervical or inguinal lymphadenopathy. Skin: No rashes, bruises or suspicious lesions. Neurologic: Grossly intact, no focal deficits, moving all 4 extremities. Psychiatric: Normal mood and affect.    Assessment & Plan:    1. Benign prostatic hyperplasia with lower urinary tract symptoms PVR by bladder scan today was 0 mL.  He will continue finasteride/tamsulosin which is refilled by his PCP.  He desires to continue annual PSA screening.  2.  Erectile dysfunction Trimix refilled  Continue annual follow-up   Riki AltesScott C Stoioff, MD  [facility] [address], [address] [place], [place] [address] [phone]

## 2017-12-21 LAB — PSA: Prostate Specific Ag, Serum: 0.8 ng/mL (ref 0.0–4.0)

## 2018-12-19 ENCOUNTER — Other Ambulatory Visit: Payer: Self-pay

## 2018-12-19 DIAGNOSIS — N401 Enlarged prostate with lower urinary tract symptoms: Secondary | ICD-10-CM

## 2018-12-20 ENCOUNTER — Other Ambulatory Visit: Payer: Self-pay

## 2018-12-20 ENCOUNTER — Other Ambulatory Visit: Payer: Medicare Other

## 2018-12-20 DIAGNOSIS — N401 Enlarged prostate with lower urinary tract symptoms: Secondary | ICD-10-CM

## 2018-12-21 LAB — PSA: Prostate Specific Ag, Serum: 0.8 ng/mL (ref 0.0–4.0)

## 2018-12-23 ENCOUNTER — Other Ambulatory Visit: Payer: Self-pay

## 2018-12-23 ENCOUNTER — Ambulatory Visit (INDEPENDENT_AMBULATORY_CARE_PROVIDER_SITE_OTHER): Payer: Medicare Other | Admitting: Urology

## 2018-12-23 ENCOUNTER — Encounter: Payer: Self-pay | Admitting: Urology

## 2018-12-23 VITALS — BP 119/73 | HR 58 | Ht 71.0 in | Wt 172.0 lb

## 2018-12-23 DIAGNOSIS — R350 Frequency of micturition: Secondary | ICD-10-CM

## 2018-12-23 DIAGNOSIS — N401 Enlarged prostate with lower urinary tract symptoms: Secondary | ICD-10-CM

## 2018-12-23 DIAGNOSIS — N5201 Erectile dysfunction due to arterial insufficiency: Secondary | ICD-10-CM

## 2018-12-23 MED ORDER — NONFORMULARY OR COMPOUNDED ITEM: 6 refills | Status: DC

## 2018-12-23 NOTE — Progress Notes (Signed)
12/23/2018 10:17 AM   Jeff Spencer 25-Jun-1950 [id number]  Referring provider: Lauro Regulus, MD [address] [facility] [place],  [place] [address]  Chief Complaint  Patient presents with  . Benign Prostatic Hypertrophy    1year    Urologic history:  1 -BPH with lower urinary tract symptoms Combination therapy finasteride/tamsulosin  2 -history of gross hematuria eval 07/2014 with large prostate as likely cause   3 - Erectile Dysfunction Uses Trimix periodically  4 - Peyronie's Disease No significant issues   HPI: 68 y.o. male presents for annual follow-up.  He states he has done well the past year.  Denies bothersome lower urinary tract symptoms.  He continues on finasteride and takes tamsulosin as needed.  He states he has not taken for the past several months.  Denies dysuria or gross hematuria.  No flank, abdominal, pelvic or scrotal pain.  He requested a refill of Trimix.  PSA performed earlier this month was stable at 0.8.   PMH: Past Medical History:  Diagnosis Date  . Arthritis, degenerative 08/02/2013  . Benign prostatic hyperplasia with urinary obstruction 08/02/2013   Last Assessment & Plan:  Urinary symptoms are doing well on flomax.    . Bladder neck contracture   . BPH (benign prostatic hyperplasia)   . ED (erectile dysfunction)   . Encounter for general adult medical examination without abnormal findings 09/11/2014   Overview:  Colonoscopy 07 and normal. Zostavax rx 2015, pneumovax 2016   . Frequency   . Gross hematuria   . Hemoglobin low   . Hypertension   . Kidney stone on left side   . Left ventricular hypertrophy 08/02/2013   Last Assessment & Plan:  Bp is controlled and not sob.      Surgical History: Past Surgical History:  Procedure Laterality Date  . LUMBAR LAMINECTOMY    . MELANOMA EXCISION    . Pheochromocytoma Excision      Home Medications:  Allergies as of 12/23/2018      Reactions   Hydrocodone  Hives, Itching   Penicillin V Potassium Other (See Comments)   Sulfa Antibiotics    Other reaction(s): Unknown      Medication List       Accurate as of December 23, 2018 10:17 AM. If you have any questions, ask your nurse or doctor.        finasteride 5 MG tablet Commonly known as: PROSCAR Take 1 tablet (5 mg total) by mouth daily.   naproxen 500 MG tablet Commonly known as: NAPROSYN Take by mouth.   pantoprazole 40 MG tablet Commonly known as: PROTONIX Take by mouth.   tamsulosin 0.4 MG Caps capsule Commonly known as: FLOMAX Take 1 capsule (0.4 mg total) by mouth daily.   triamcinolone cream 0.1 % Commonly known as: KENALOG APPLY TO AFFECTED AREA TWICE A DAY       Allergies:  Allergies  Allergen Reactions  . Hydrocodone Hives and Itching  . Penicillin V Potassium Other (See Comments)  . Sulfa Antibiotics     Other reaction(s): Unknown    Family History: Family History  Problem Relation Age of Onset  . Aneurysm Father   . Heart disease Father   . Diabetes Father   . Prostate cancer Paternal Grandfather   . Diabetes Mother   . Kidney disease Neg Hx   . Bladder Cancer Neg Hx     Social History:  reports that he has quit smoking. He has never  used smokeless tobacco. He reports current alcohol use. He reports that he does not use drugs.  ROS: UROLOGY Frequent Urination?: No Hard to postpone urination?: No Burning/pain with urination?: No Get up at night to urinate?: Yes Leakage of urine?: No Urine stream starts and stops?: No Trouble starting stream?: No Do you have to strain to urinate?: No Blood in urine?: No Urinary tract infection?: No Sexually transmitted disease?: No Injury to kidneys or bladder?: No Painful intercourse?: No Weak stream?: No Erection problems?: Yes Penile pain?: No  Gastrointestinal Nausea?: No Vomiting?: No Indigestion/heartburn?: No Diarrhea?: No Constipation?: No  Constitutional Fever: No Night sweats?:  No Weight loss?: No Fatigue?: No  Skin Skin rash/lesions?: No Itching?: No  Eyes Blurred vision?: No Double vision?: No  Ears/Nose/Throat Sinus problems?: No  Hematologic/Lymphatic Swollen glands?: No Easy bruising?: No  Cardiovascular Leg swelling?: No Chest pain?: No  Respiratory Cough?: No Shortness of breath?: No  Endocrine Excessive thirst?: No  Musculoskeletal Back pain?: No Joint pain?: No  Neurological Headaches?: No Dizziness?: No  Psychologic Depression?: No Anxiety?: No  Physical Exam: BP 119/73   Pulse (!) 58   Ht 5\' 11"  (1.803 m)   Wt 172 lb (78 kg)   BMI 23.99 kg/m   Constitutional:  Alert and oriented, No acute distress. HEENT: [place] AT, moist mucus membranes.  Trachea midline, no masses. Cardiovascular: No clubbing, cyanosis, or edema. Respiratory: Normal respiratory effort, no increased work of breathing. GU: Prostate 40 g, smooth without nodules Skin: No rashes, bruises or suspicious lesions. Neurologic: Grossly intact, no focal deficits, moving all 4 extremities. Psychiatric: Normal mood and affect.   Assessment & Plan:    - BPH with lower urinary tract symptoms Continue finasteride and tamsulosin prn.  He states his PCP is refilling these medications.  We discussed prostate cancer screening guidelines and that screening is not recommended after age 71.  Follow-up 1 year.  - Erectile dysfunction Refill Tr[facility] sent to [facility].   Abbie Sons, [place] [address] [place], [place] [address] [phone]

## 2018-12-30 ENCOUNTER — Ambulatory Visit: Payer: BC Managed Care – PPO | Admitting: Urology

## 2019-10-22 LAB — COLOGUARD: COLOGUARD: POSITIVE — AB

## 2019-12-25 ENCOUNTER — Ambulatory Visit: Payer: Medicare Other | Admitting: Urology

## 2019-12-29 ENCOUNTER — Other Ambulatory Visit: Payer: Self-pay

## 2019-12-29 ENCOUNTER — Encounter: Payer: Self-pay | Admitting: Urology

## 2019-12-29 ENCOUNTER — Ambulatory Visit: Payer: Medicare PPO | Admitting: Urology

## 2019-12-29 VITALS — BP 144/82 | HR 60 | Ht 71.0 in | Wt 172.0 lb

## 2019-12-29 DIAGNOSIS — N401 Enlarged prostate with lower urinary tract symptoms: Secondary | ICD-10-CM

## 2019-12-29 DIAGNOSIS — N5201 Erectile dysfunction due to arterial insufficiency: Secondary | ICD-10-CM | POA: Diagnosis not present

## 2019-12-29 DIAGNOSIS — R350 Frequency of micturition: Secondary | ICD-10-CM | POA: Diagnosis not present

## 2019-12-29 LAB — BLADDER SCAN AMB NON-IMAGING: Scan Result: 0

## 2019-12-29 MED ORDER — NONFORMULARY OR COMPOUNDED ITEM: 6 refills | Status: DC

## 2019-12-29 NOTE — Progress Notes (Signed)
12/29/2019 10:09 AM   Jeff Spencer 1950-08-23 [id number]  Referring provider: Lauro Regulus, MD [address] [facility] [place],  [place] [address]  Chief Complaint  Patient presents with   Benign Prostatic Hypertrophy    Urologichistory:  1 -BPH with lower urinary tract symptoms On finasteride Discontinued tamsulosin   2-history of gross hematuria eval 07/2014 with large prostate as likely cause   3- Erectile Dysfunction Uses Trimix periodically  4 - Peyronie's Disease No significant issues   HPI: 69 y.o. male presents for annual follow-up.   Since visit last year has noted increased nocturia at 2 times per night  Denies recurrent gross hematuria  Remains on Trimix which is effective  IPSS today 10/35  PSA checked by PCP 09/2019 was 1.04   PMH: Past Medical History:  Diagnosis Date   Arthritis, degenerative 08/02/2013   Benign prostatic hyperplasia with urinary obstruction 08/02/2013   Last Assessment & Plan:  Urinary symptoms are doing well on flomax.     Bladder neck contracture    BPH (benign prostatic hyperplasia)    ED (erectile dysfunction)    Encounter for general adult medical examination without abnormal findings 09/11/2014   Overview:  Colonoscopy 07 and normal. Zostavax rx 2015, pneumovax 2016    Frequency    Gross hematuria    Hemoglobin low    Hypertension    Kidney stone on left side    Left ventricular hypertrophy 08/02/2013   Last Assessment & Plan:  Bp is controlled and not sob.      Surgical History: Past Surgical History:  Procedure Laterality Date   LUMBAR LAMINECTOMY     MELANOMA EXCISION     Pheochromocytoma Excision      Home Medications:  Allergies as of 12/29/2019      Reactions   Hydrocodone Hives, Itching   Penicillin V Potassium Other (See Comments)   Sulfa Antibiotics    Other reaction(s): Unknown      Medication List       Accurate as of December 29, 2019 10:09  AM. If you have any questions, ask your nurse or doctor.        STOP taking these medications   tamsulosin 0.4 MG Caps capsule Commonly known as: FLOMAX Stopped by: Riki Altes, MD     TAKE these medications   finasteride 5 MG tablet Commonly known as: PROSCAR Take 1 tablet (5 mg total) by mouth daily.   naproxen 500 MG tablet Commonly known as: NAPROSYN Take by mouth.   NONFORMULARY OR COMPOUNDED ITEM Trimix (30/1/10)-(Pap/Phent/PGE)  Dosage: Inject 0.2 cc per injection   Vial 25ml  35ml vials  Qty #5 vials Refills 6  [facility] [phone] Fax [phone]   pantoprazole 40 MG tablet Commonly known as: PROTONIX Take by mouth.   triamcinolone 0.1 % Commonly known as: KENALOG APPLY TO AFFECTED AREA TWICE A DAY       Allergies:  Allergies  Allergen Reactions   Hydrocodone Hives and Itching   Penicillin V Potassium Other (See Comments)   Sulfa Antibiotics     Other reaction(s): Unknown    Family History: Family History  Problem Relation Age of Onset   Aneurysm Father    Heart disease Father    Diabetes Father    Prostate cancer Paternal Grandfather    Diabetes Mother    Kidney disease Neg Hx    Bladder Cancer Neg Hx     Social History:  reports  that he has quit smoking. He has never used smokeless tobacco. He reports current alcohol use. He reports that he does not use drugs.   Physical Exam: BP (!) 144/82    Pulse 60    Ht 5\' 11"  (1.803 m)    Wt 172 lb (78 kg)    BMI 23.99 kg/m   Constitutional:  Alert and oriented, No acute distress. HEENT: [place] AT, moist mucus membranes.  Trachea midline, no masses. Cardiovascular: No clubbing, cyanosis, or edema. Respiratory: Normal respiratory effort, no increased work of breathing. GI: Abdomen is soft, nontender, nondistended, no abdominal masses GU: Prostate 40 g, smooth without nodules Skin: No rashes, bruises or suspicious lesions. Neurologic: Grossly intact, no focal deficits,  moving all 4 extremities. Psychiatric: Normal mood and affect.  Laboratory Data:  Urinalysis Dipstick/microscopy negative  Assessment & Plan:    1. Benign prostatic hyperplasia with urinary frequency  Mild worsening of LUTS with increased nocturia  Bladder scan PVR 0 mL  He was potentially interested in UroLift and the procedure was discussed.  He was given literature and interested in pursuing will schedule cystoscopy and TRUS  2.  Erectile dysfunction  Trimix refilled    , MD  [facility] [address] [place], [place] [phone]

## 2019-12-30 LAB — URINALYSIS, COMPLETE
Bilirubin, UA: NEGATIVE
Glucose, UA: NEGATIVE
Ketones, UA: NEGATIVE
Leukocytes,UA: NEGATIVE
Nitrite, UA: NEGATIVE
RBC, UA: NEGATIVE
Specific Gravity, UA: 1.02 (ref 1.005–1.030)
Urobilinogen, Ur: 0.2 mg/dL (ref 0.2–1.0)
pH, UA: 6 (ref 5.0–7.5)

## 2019-12-30 LAB — MICROSCOPIC EXAMINATION
Bacteria, UA: NONE SEEN
Epithelial Cells (non renal): NONE SEEN /hpf (ref 0–10)
RBC, Urine: NONE SEEN /hpf (ref 0–2)

## 2020-12-29 ENCOUNTER — Encounter: Payer: Self-pay | Admitting: Urology

## 2020-12-29 ENCOUNTER — Other Ambulatory Visit: Payer: Self-pay

## 2020-12-29 ENCOUNTER — Ambulatory Visit: Payer: Medicare PPO | Admitting: Urology

## 2020-12-29 VITALS — BP 138/81 | HR 61 | Ht 71.0 in | Wt 178.0 lb

## 2020-12-29 DIAGNOSIS — N401 Enlarged prostate with lower urinary tract symptoms: Secondary | ICD-10-CM | POA: Diagnosis not present

## 2020-12-29 DIAGNOSIS — N5201 Erectile dysfunction due to arterial insufficiency: Secondary | ICD-10-CM | POA: Diagnosis not present

## 2020-12-29 DIAGNOSIS — R350 Frequency of micturition: Secondary | ICD-10-CM | POA: Diagnosis not present

## 2020-12-29 DIAGNOSIS — R310 Gross hematuria: Secondary | ICD-10-CM

## 2020-12-29 LAB — URINALYSIS, COMPLETE
Bilirubin, UA: NEGATIVE
Glucose, UA: NEGATIVE
Ketones, UA: NEGATIVE
Leukocytes,UA: NEGATIVE
Nitrite, UA: NEGATIVE
Protein,UA: NEGATIVE
RBC, UA: NEGATIVE
Specific Gravity, UA: 1.015 (ref 1.005–1.030)
Urobilinogen, Ur: 0.2 mg/dL (ref 0.2–1.0)
pH, UA: 6 (ref 5.0–7.5)

## 2020-12-29 LAB — MICROSCOPIC EXAMINATION
Bacteria, UA: NONE SEEN
Epithelial Cells (non renal): NONE SEEN /hpf (ref 0–10)
RBC, Urine: NONE SEEN /hpf (ref 0–2)

## 2020-12-29 LAB — BLADDER SCAN AMB NON-IMAGING: Scan Result: 15

## 2020-12-29 MED ORDER — FINASTERIDE 5 MG PO TABS: 5.0000 mg | ORAL_TABLET | Freq: Every day | ORAL | 3 refills | Status: DC

## 2020-12-29 MED ORDER — NONFORMULARY OR COMPOUNDED ITEM: 6 refills | Status: AC

## 2020-12-29 NOTE — Progress Notes (Signed)
12/29/2020 9:08 AM   Jeff Spencer 02/19/1950 [id number]  Referring provider: Lauro Regulus, MD [address] [facility] [address] [facility] [place],  [place] [address]  Chief Complaint  Patient presents with   Benign Prostatic Hypertrophy    Urologic history:   1. BPH with lower urinary tract symptoms On finasteride Discontinued tamsulosin    2. History of gross hematuria eval 07/2014 with large prostate as likely cause    3.  Erectile Dysfunction  Uses Trimix periodically   4. Peyronie's Disease No significant issues   HPI: 70 y.o. male presents for annual follow-up.  Doing well since last visit Since last years visit has noted increased postvoid dribbling IPSS today 10/35 (same as last year) Has had 2 episodes of gross hematuria since last visit Denies flank, abdominal or pelvic pain PSA checked by PCP 09/27/2020 1.11 (uncorrected)  PMH: Past Medical History:  Diagnosis Date   Arthritis, degenerative 08/02/2013   Benign prostatic hyperplasia with urinary obstruction 08/02/2013   Last Assessment & Plan:  Urinary symptoms are doing well on flomax.     Bladder neck contracture    BPH (benign prostatic hyperplasia)    ED (erectile dysfunction)    Encounter for general adult medical examination without abnormal findings 09/11/2014   Overview:  Colonoscopy 07 and normal. Zostavax rx 2015, pneumovax 2016    Frequency    Gross hematuria    Hemoglobin low    Hypertension    Kidney stone on left side    Left ventricular hypertrophy 08/02/2013   Last Assessment & Plan:  Bp is controlled and not sob.      Surgical History: Past Surgical History:  Procedure Laterality Date   LUMBAR LAMINECTOMY     MELANOMA EXCISION     Pheochromocytoma Excision      Home Medications:  Allergies as of 12/29/2020       Reactions   Hydrocodone Hives, Itching   Penicillin V Potassium Other (See Comments)   Sulfa Antibiotics    Other reaction(s): Unknown        Medication  List        Accurate as of December 29, 2020  9:08 AM. If you have any questions, ask your nurse or doctor.          finasteride 5 MG tablet Commonly known as: PROSCAR Take 1 tablet (5 mg total) by mouth daily.   naproxen 500 MG tablet Commonly known as: NAPROSYN Take by mouth.   NONFORMULARY OR COMPOUNDED ITEM Trimix (30/1/10)-(Pap/Phent/PGE)  Dosage: Inject 0.2 cc per injection   Vial 34ml  21ml vials  Qty #5 vials Refills 6  [facility] [phone] Fax [phone]   pantoprazole 40 MG tablet Commonly known as: PROTONIX Take by mouth.   triamcinolone cream 0.1 % Commonly known as: KENALOG APPLY TO AFFECTED AREA TWICE A DAY        Allergies:  Allergies  Allergen Reactions   Hydrocodone Hives and Itching   Penicillin V Potassium Other (See Comments)   Sulfa Antibiotics     Other reaction(s): Unknown    Family History: Family History  Problem Relation Age of Onset   Aneurysm Father    Heart disease Father    Diabetes Father    Prostate cancer Paternal Grandfather    Diabetes Mother    Kidney disease Neg Hx    Bladder Cancer Neg Hx     Social History:  reports that he has quit smoking. He has never used  smokeless tobacco. He reports current alcohol use. He reports that he does not use drugs.   Physical Exam: BP 138/81    Pulse 61    Ht 5\' 11"  (1.803 m)    Wt 178 lb (80.7 kg)    BMI 24.83 kg/m   Constitutional:  Alert and oriented, No acute distress. HEENT: [place] AT, moist mucus membranes.  Trachea midline, no masses. Cardiovascular: No clubbing, cyanosis, or edema. Respiratory: Normal respiratory effort, no increased work of breathing. GU: Prostate 50 g, smooth without nodules Skin: No rashes, bruises or suspicious lesions. Neurologic: Grossly intact, no focal deficits, moving all 4 extremities. Psychiatric: Normal mood and affect.   Assessment & Plan:    1.  BPH with LUTS Some increased postvoid dribbling but states voiding  symptoms not bothersome enough that he desires additional medication or an outlet procedure Finasteride refilled  2.  Gross hematuria Recurrent Most likely secondary to BPH however recommend CTU/cystoscopy for upper/lower tract evaluation  3.  Erectile dysfunction Trimix refilled   Abbie Sons, MD  [facility] [address] [place], [place] [address] [phone]

## 2021-01-19 ENCOUNTER — Other Ambulatory Visit: Payer: Self-pay

## 2021-01-19 ENCOUNTER — Ambulatory Visit
Admission: RE | Admit: 2021-01-19 | Discharge: 2021-01-19 | Disposition: A | Discharge status: Home / Self Care | Payer: Medicare PPO | Source: Ambulatory Visit | Attending: Urology | Admitting: Urology

## 2021-01-19 DIAGNOSIS — R310 Gross hematuria: Secondary | ICD-10-CM | POA: Insufficient documentation

## 2021-01-19 LAB — POCT I-STAT CREATININE: Creatinine, Ser: 1.2 mg/dL (ref 0.61–1.24)

## 2021-01-19 MED ORDER — IOHEXOL 350 MG/ML SOLN
100.0000 mL | Freq: Once | INTRAVENOUS | Status: AC | PRN
  Administered 2021-01-19: 100 mL via INTRAVENOUS

## 2021-01-21 ENCOUNTER — Telehealth: Payer: Self-pay | Admitting: *Deleted

## 2021-01-21 NOTE — Telephone Encounter (Signed)
-----   Message from Riki Altes, MD sent at 01/21/2021  7:29 AM EST ----- CT urogram showed a small nonobstructing stone in the left kidney which would not be a cause of visible blood in the urine.  Keep cystoscopy appointment scheduled for February

## 2021-01-21 NOTE — Telephone Encounter (Signed)
Notified patient as instructed, patient pleased. Discussed follow-up appointments, patient agrees  

## 2021-02-04 ENCOUNTER — Ambulatory Visit: Payer: Medicare PPO | Admitting: Urology

## 2021-02-04 ENCOUNTER — Encounter: Payer: Self-pay | Admitting: Urology

## 2021-02-04 ENCOUNTER — Other Ambulatory Visit: Payer: Self-pay

## 2021-02-04 VITALS — BP 153/90 | HR 63 | Ht 71.0 in | Wt 178.0 lb

## 2021-02-04 DIAGNOSIS — R310 Gross hematuria: Secondary | ICD-10-CM | POA: Diagnosis not present

## 2021-02-04 LAB — URINALYSIS, COMPLETE
Bilirubin, UA: NEGATIVE
Glucose, UA: NEGATIVE
Ketones, UA: NEGATIVE
Leukocytes,UA: NEGATIVE
Nitrite, UA: NEGATIVE
Protein,UA: NEGATIVE
Specific Gravity, UA: 1.02 (ref 1.005–1.030)
Urobilinogen, Ur: 0.2 mg/dL (ref 0.2–1.0)
pH, UA: 5.5 (ref 5.0–7.5)

## 2021-02-04 LAB — MICROSCOPIC EXAMINATION
Bacteria, UA: NONE SEEN
Epithelial Cells (non renal): NONE SEEN /hpf (ref 0–10)

## 2021-02-04 NOTE — Progress Notes (Signed)
° °  02/04/21  CC:  Chief Complaint  Patient presents with   Cysto   Indications: Gross hematuria  HPI: No complaints today.  Denies recurrent gross hematuria.  CTU 01/19/2021 with small nonobstructing left lower pole renal calculus and bilateral parapelvic cysts  Blood pressure (!) 153/90, pulse 63, height 5\' 11"  (1.803 m), weight 178 lb (80.7 kg). NED. A&Ox3.   No respiratory distress   Abd soft, NT, ND Normal phallus with bilateral descended testicles  Cystoscopy Procedure Note  Patient identification was confirmed, informed consent was obtained, and patient was prepped using Betadine solution.  Lidocaine jelly was administered per urethral meatus.     Pre-Procedure: - Inspection reveals a normal caliber ureteral meatus.  Procedure: The flexible cystoscope was introduced without difficulty - No urethral strictures/lesions are present. -  Prominent lateral lobe enlargement  prostate with hypervascularity/superficial vessels -  Mild elevation  bladder neck - Bilateral ureteral orifices identified - Bladder mucosa  reveals no ulcers, tumors, or lesions - No bladder stones - Mild trabeculation  Retroflexion shows mild intravesical protrusion.  Prominent superficial vessels at bladder neck   Post-Procedure: - Patient tolerated the procedure well  Assessment/ Plan: No bladder mucosal abnormalities BPH with hypervascularity which is the most likely source of his gross hematuria.  Prostate volume by CT 68 g Punctate left renal calculus Bilateral parapelvic renal cysts Follow-up 1 year with UA   Abbie Sons, MD

## 2022-02-06 ENCOUNTER — Encounter: Payer: Self-pay | Admitting: Urology

## 2022-02-06 ENCOUNTER — Ambulatory Visit: Payer: Medicare PPO | Admitting: Urology

## 2022-02-06 VITALS — BP 145/79 | HR 66 | Ht 71.0 in | Wt 165.0 lb

## 2022-02-06 DIAGNOSIS — N5201 Erectile dysfunction due to arterial insufficiency: Secondary | ICD-10-CM

## 2022-02-06 DIAGNOSIS — R350 Frequency of micturition: Secondary | ICD-10-CM | POA: Diagnosis not present

## 2022-02-06 DIAGNOSIS — N401 Enlarged prostate with lower urinary tract symptoms: Secondary | ICD-10-CM | POA: Diagnosis not present

## 2022-02-06 DIAGNOSIS — R310 Gross hematuria: Secondary | ICD-10-CM

## 2022-02-06 DIAGNOSIS — Z87898 Personal history of other specified conditions: Secondary | ICD-10-CM

## 2022-02-06 LAB — URINALYSIS, COMPLETE
Bilirubin, UA: NEGATIVE
Ketones, UA: NEGATIVE
Leukocytes,UA: NEGATIVE
Nitrite, UA: NEGATIVE
Protein,UA: NEGATIVE
Specific Gravity, UA: 1.015 (ref 1.005–1.030)
Urobilinogen, Ur: 0.2 mg/dL (ref 0.2–1.0)
pH, UA: 7 (ref 5.0–7.5)

## 2022-02-06 LAB — MICROSCOPIC EXAMINATION

## 2022-02-06 NOTE — Progress Notes (Unsigned)
02/06/2022 10:48 AM   Jeff Spencer 02-Jul-1950 [id number]  Referring provider: Kirk Ruths, MD [facility] [place],  [place] [address]  Chief Complaint  Patient presents with   Benign Prostatic Hypertrophy    Urologic history:   1. BPH with lower urinary tract symptoms On finasteride Discontinued tamsulosin    2. History of gross hematuria eval 07/2014 with prominent BPH   3.  Erectile Dysfunction  Uses Trimix periodically   4. Peyronie's Disease No significant issues   HPI: 72 y.o. male presents for annual follow-up.  Doing well since last visit Stable voiding symptoms on finasteride No recurrent gross hematuria Denies flank, abdominal or pelvic pain PSA checked by PCP 09/28/2021 stable at 1.21 (uncorrected)  PMH: Past Medical History:  Diagnosis Date   Arthritis, degenerative 08/02/2013   Benign prostatic hyperplasia with urinary obstruction 08/02/2013   Last Assessment & Plan:  Urinary symptoms are doing well on flomax.     Bladder neck contracture    BPH (benign prostatic hyperplasia)    ED (erectile dysfunction)    Encounter for general adult medical examination without abnormal findings 09/11/2014   Overview:  Colonoscopy 07 and normal. Zostavax rx 2015, pneumovax 2016    Frequency    Gross hematuria    Hemoglobin low    Hypertension    Kidney stone on left side    Left ventricular hypertrophy 08/02/2013   Last Assessment & Plan:  Bp is controlled and not sob.      Surgical History: Past Surgical History:  Procedure Laterality Date   LUMBAR LAMINECTOMY     MELANOMA EXCISION     Pheochromocytoma Excision      Home Medications:  Allergies as of 02/06/2022       Reactions   Hydrocodone Hives, Itching   Penicillin V Potassium Other (See Comments)   Sulfa Antibiotics    Other reaction(s): Unknown   Doxycycline Rash        Medication List        Accurate as of February 06, 2022 10:48 AM. If you have any  questions, ask your nurse or doctor.          finasteride 5 MG tablet Commonly known as: PROSCAR Take 1 tablet (5 mg total) by mouth daily.   Jardiance 10 MG Tabs tablet Generic drug: empagliflozin Take 10 mg by mouth daily.   losartan-hydrochlorothiazide 100-12.5 MG tablet Commonly known as: HYZAAR Take 1 tablet by mouth daily.   naproxen 500 MG tablet Commonly known as: NAPROSYN Take by mouth.   NONFORMULARY OR COMPOUNDED ITEM Trimix (30/1/10)-(Pap/Phent/PGE)  Dosage: Inject 0.2 cc per injection   Vial 40ml  30ml vials  Qty #5 vials Refills 6  [facility] [phone] Fax [phone]   pantoprazole 40 MG tablet Commonly known as: PROTONIX Take by mouth.   triamcinolone cream 0.1 % Commonly known as: KENALOG APPLY TO AFFECTED AREA TWICE A DAY        Allergies:  Allergies  Allergen Reactions   Hydrocodone Hives and Itching   Penicillin V Potassium Other (See Comments)   Sulfa Antibiotics     Other reaction(s): Unknown   Doxycycline Rash    Family History: Family History  Problem Relation Age of Onset   Aneurysm Father    Heart disease Father    Diabetes Father    Prostate cancer Paternal Grandfather    Diabetes Mother    Kidney disease Neg Hx    Bladder  Cancer Neg Hx     Social History:  reports that he has quit smoking. He has never used smokeless tobacco. He reports current alcohol use. He reports that he does not use drugs.   Physical Exam: BP (!) 145/79   Pulse 66   Ht 5\' 11"  (1.803 m)   Wt 165 lb (74.8 kg)   BMI 23.01 kg/m   Constitutional:  Alert and oriented, No acute distress. HEENT: [place] AT, moist mucus membranes.  Trachea midline, no masses. Cardiovascular: No clubbing, cyanosis, or edema. Respiratory: Normal respiratory effort, no increased work of breathing. GU: Prostate 50 g, smooth without nodules Skin: No rashes, bruises or suspicious lesions. Neurologic: Grossly intact, no focal deficits, moving all 4  extremities. Psychiatric: Normal mood and affect.   Assessment & Plan:    1.  BPH with LUTS Stable Finasteride refilled  2.  History gross hematuria No recurrent hematuria UA today clear  3.  Erectile dysfunction Stable   Abbie Sons, MD  [facility] [address], [place],  [address] [phone]

## 2022-02-07 ENCOUNTER — Encounter: Payer: Self-pay | Admitting: Urology

## 2022-02-07 MED ORDER — FINASTERIDE 5 MG PO TABS: 5.0000 mg | ORAL_TABLET | Freq: Every day | ORAL | 3 refills | Status: AC

## 2023-02-07 ENCOUNTER — Ambulatory Visit: Payer: Medicare PPO | Admitting: Urology

## 2023-03-01 ENCOUNTER — Ambulatory Visit: Payer: Medicare PPO | Admitting: Urology

## 2023-03-01 ENCOUNTER — Encounter: Payer: Self-pay | Admitting: Urology

## 2023-03-01 VITALS — BP 109/75 | HR 72 | Ht 71.0 in | Wt 160.0 lb

## 2023-03-01 DIAGNOSIS — N5201 Erectile dysfunction due to arterial insufficiency: Secondary | ICD-10-CM

## 2023-03-01 DIAGNOSIS — R972 Elevated prostate specific antigen [PSA]: Secondary | ICD-10-CM

## 2023-03-01 DIAGNOSIS — N401 Enlarged prostate with lower urinary tract symptoms: Secondary | ICD-10-CM | POA: Diagnosis not present

## 2023-03-01 DIAGNOSIS — R350 Frequency of micturition: Secondary | ICD-10-CM

## 2023-03-01 DIAGNOSIS — Z87898 Personal history of other specified conditions: Secondary | ICD-10-CM

## 2023-03-01 LAB — URINALYSIS, COMPLETE
Bilirubin, UA: NEGATIVE
Ketones, UA: NEGATIVE
Leukocytes,UA: NEGATIVE
Nitrite, UA: NEGATIVE
Specific Gravity, UA: 1.02 (ref 1.005–1.030)
Urobilinogen, Ur: 0.2 mg/dL (ref 0.2–1.0)
pH, UA: 5.5 (ref 5.0–7.5)

## 2023-03-01 LAB — MICROSCOPIC EXAMINATION

## 2023-03-01 LAB — BLADDER SCAN AMB NON-IMAGING

## 2023-03-01 NOTE — Progress Notes (Signed)
 I, Maysun Anabel Bene, acting as a scribe for Riki Altes, MD., have documented all relevant documentation on the behalf of Riki Altes, MD, as directed by Riki Altes, MD while in the presence of Riki Altes, MD.  03/01/2023 12:43 PM   Jeff Spencer 12-19-1950 [id number]  Referring provider: Lauro Regulus, MD [address] [facility] [place],  [place] [address]  Chief Complaint  Patient presents with   Follow-up   Urologic history:  1. BPH with lower urinary tract symptoms On finasteride Discontinued tamsulosin    2. History of gross hematuria eval 07/2014 with prominent BPH   3.  Erectile Dysfunction  Uses Trimix periodically   4. Peyronie's Disease No significant issues  HPI: Jeff Spencer is a 73 y.o. male presents for annual follow-up copy for the   Doing well since last visit Stable voiding symptoms on finasteride No recurrent gross hematuria Denies flank, abdominal or pelvic pain PSA 10/05/22 2.06   PMH: Past Medical History:  Diagnosis Date   Arthritis, degenerative 08/02/2013   Benign prostatic hyperplasia with urinary obstruction 08/02/2013   Last Assessment & Plan:  Urinary symptoms are doing well on flomax.     Bladder neck contracture    BPH (benign prostatic hyperplasia)    ED (erectile dysfunction)    Encounter for general adult medical examination without abnormal findings 09/11/2014   Overview:  Colonoscopy 07 and normal. Zostavax rx 2015, pneumovax 2016    Frequency    Gross hematuria    Hemoglobin low    Hypertension    Kidney stone on left side    Left ventricular hypertrophy 08/02/2013   Last Assessment & Plan:  Bp is controlled and not sob.      Surgical History: Past Surgical History:  Procedure Laterality Date   LUMBAR LAMINECTOMY     MELANOMA EXCISION     Pheochromocytoma Excision      Home Medications:  Allergies as of 03/01/2023       Reactions   Hydrocodone Hives, Itching   Penicillin V  Potassium Other (See Comments)   Sulfa Antibiotics    Other reaction(s): Unknown   Doxycycline Rash        Medication List        Accurate as of March 01, 2023 12:43 PM. If you have any questions, ask your nurse or doctor.          acyclovir 800 MG tablet Commonly known as: ZOVIRAX Take 1 tablet by mouth 3 (three) times daily as needed.   albuterol 108 (90 Base) MCG/ACT inhaler Commonly known as: VENTOLIN HFA Inhale into the lungs.   finasteride 5 MG tablet Commonly known as: PROSCAR Take 1 tablet (5 mg total) by mouth daily.   Jardiance 10 MG Tabs tablet Generic drug: empagliflozin Take 25 mg by mouth daily.   losartan-hydrochlorothiazide 100-12.5 MG tablet Commonly known as: HYZAAR Take 1 tablet by mouth daily.   naproxen 500 MG tablet Commonly known as: NAPROSYN Take by mouth.   NONFORMULARY OR COMPOUNDED ITEM Trimix (30/1/10)-(Pap/Phent/PGE)  Dosage: Inject 0.2 cc per injection   Vial 1ml  1ml vials  Qty #5 vials Refills 6  [facility] [phone] Fax [phone]   pantoprazole 40 MG tablet Commonly known as: PROTONIX Take by mouth.   triamcinolone cream 0.1 % Commonly known as: KENALOG APPLY TO AFFECTED AREA TWICE A DAY        Allergies:  Allergies  Allergen  Reactions   Hydrocodone Hives and Itching   Penicillin V Potassium Other (See Comments)   Sulfa Antibiotics     Other reaction(s): Unknown   Doxycycline Rash    Family History: Family History  Problem Relation Age of Onset   Aneurysm Father    Heart disease Father    Diabetes Father    Prostate cancer Paternal Grandfather    Diabetes Mother    Kidney disease Neg Hx    Bladder Cancer Neg Hx     Social History:  reports that he has quit smoking. He has never used smokeless tobacco. He reports current alcohol use. He reports that he does not use drugs.   Physical Exam: BP 109/75   Pulse 72   Ht 5\' 11"  (1.803 m)   Wt 160 lb (72.6 kg)   BMI 22.32  kg/m   Constitutional:  Alert and oriented, No acute distress. HEENT: [place] AT Respiratory: Normal respiratory effort, no increased work of breathing. GU: Prostate 50 g, smooth without nodules Psychiatric: Normal mood and affect.  Assessment & Plan:    1. BPH with LUTS Stable Finasteride refilled PVR 7 mL   2.  History gross hematuria No recurrent hematuria UA today clear   3.  Erectile dysfunction Stable  4. Abnormal PSA Baseline uncorrected PSA is in the low 1 range, and his most recent PSA was 2.04 (4.1 corrected). We discussed this is an abnormal increase in the PSA level, though PSA velocity is based on 3 successive readings.  He states he has a follow-up appointment with Dr. Dareen Piano in April 2025 and recommended repeating PSA at that time. If it is still elevated above baseline, will schedule a prostate MRI.   I have reviewed the above documentation for accuracy and completeness, and I agree with the above.   Riki Altes, MD  [facility] [address] [place], [place] [address] [phone]

## 2023-05-13 ENCOUNTER — Telehealth: Payer: Self-pay | Admitting: Urology

## 2023-05-13 NOTE — Telephone Encounter (Signed)
 Follow-up PSA level performed Dr. Jonette Nestle office in April was better but still slightly elevated above baseline.  Recommend a lab visit with follow-up PSA October 2025 unless he is scheduled to have blood drawn with Dr. Alva Jewels at that time

## 2023-05-14 NOTE — Telephone Encounter (Signed)
 Patient will have pcp for psa in 6 months . Will sent over after done.

## 2023-09-17 IMAGING — CT CT ABD-PEL WO/W CM
2 of 12 series · 9 of 46 positions shown, 15 images · IV contrast (agent unspecified)
Comparison: 04/29/2014
COMPARISON: 04/29/2014

Addendum:
CLINICAL DATA: Recurrent gross hematuria

EXAM:
CT ABDOMEN AND PELVIS WITHOUT AND WITH CONTRAST
TECHNIQUE: Multidetector CT imaging of the abdomen and pelvis was performed
following the standard protocol before and following the bolus
administration of intravenous contrast.

[Series 2: abd without pre 5.00 · axial · non-contrast · 0.69mm/px · z∈[-1532,-1197]mm · 7 of 91 slices shown, 12 images]
[im 12/91  soft-tissue]
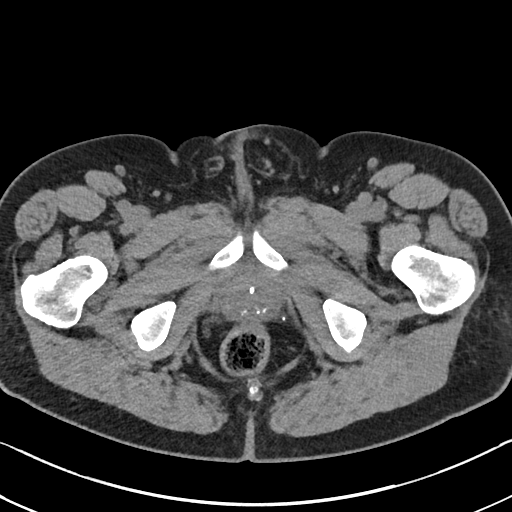
[im 12/91  bone]
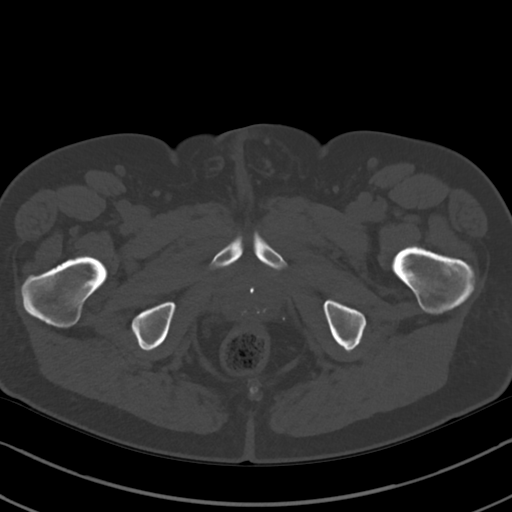
[im 23/91  soft-tissue]
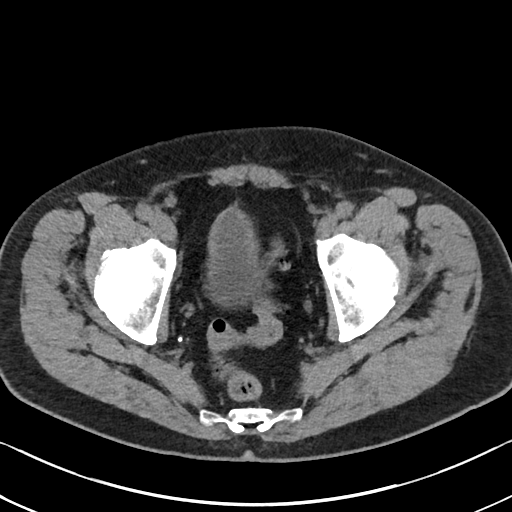
[im 34/91  soft-tissue]
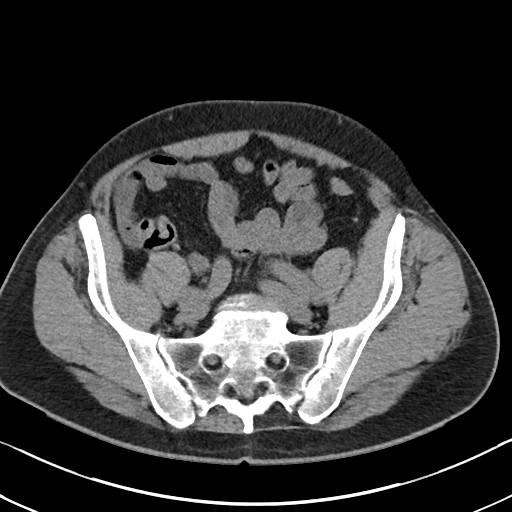
[im 46/91  soft-tissue]
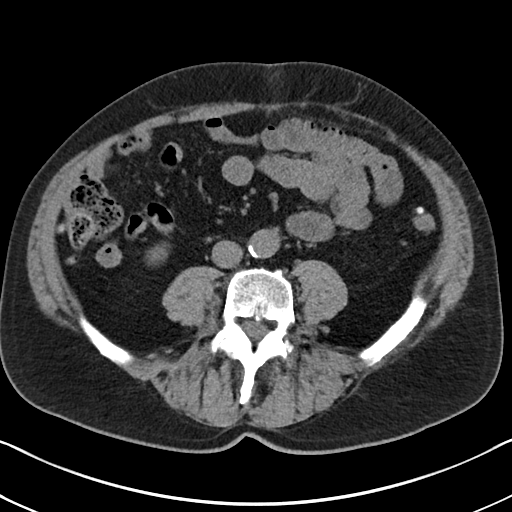
[im 46/91  lung]
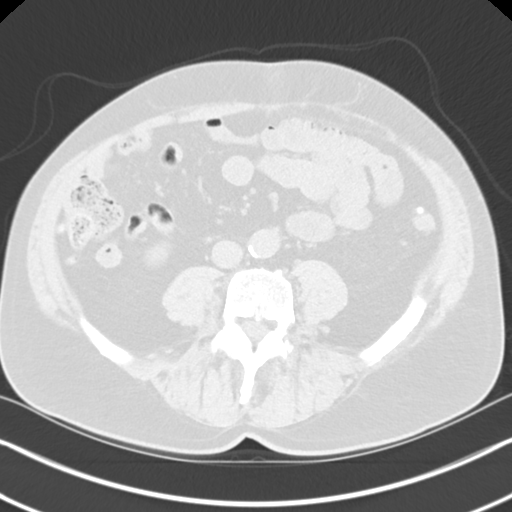
[im 57/91  soft-tissue]
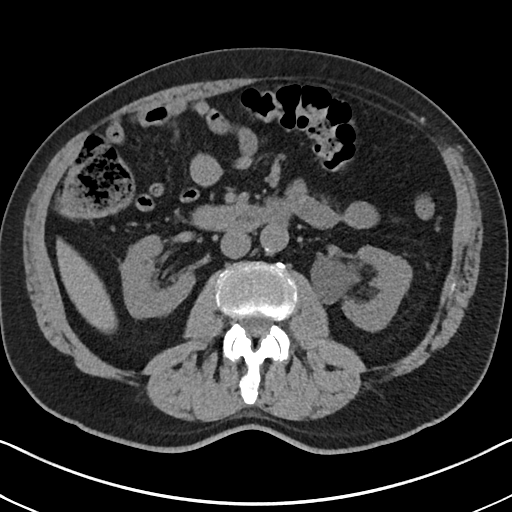
[im 57/91  lung]
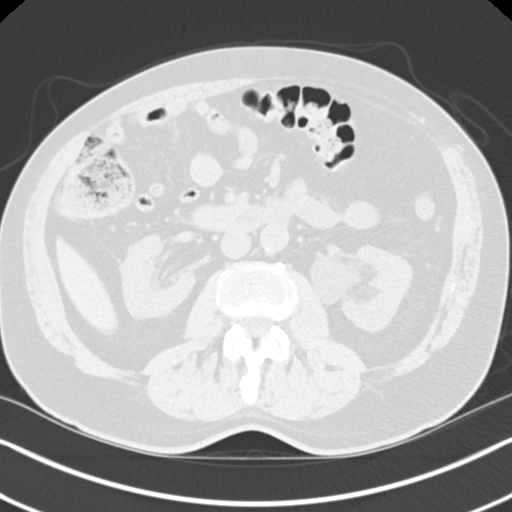
[im 68/91  soft-tissue]
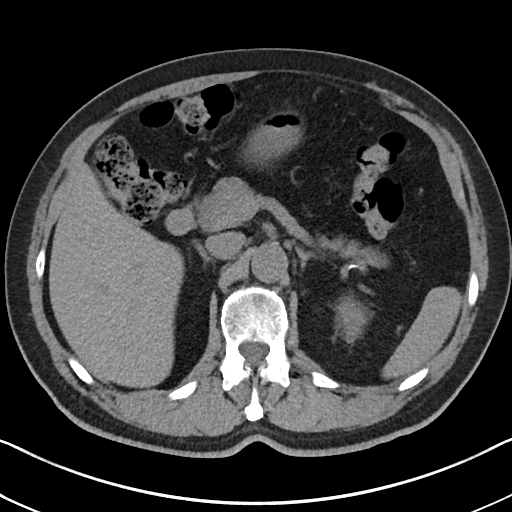
[im 68/91  lung]
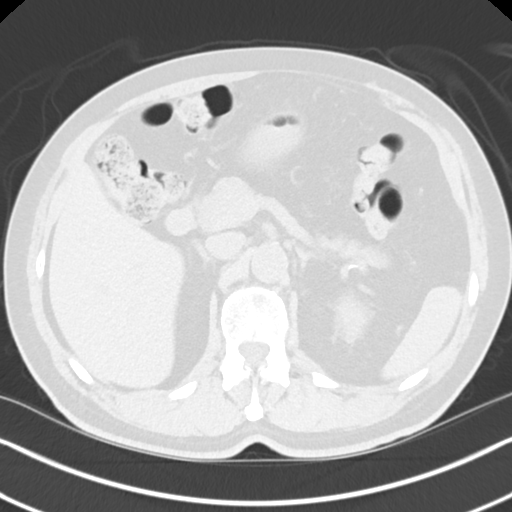
[im 79/91  soft-tissue]
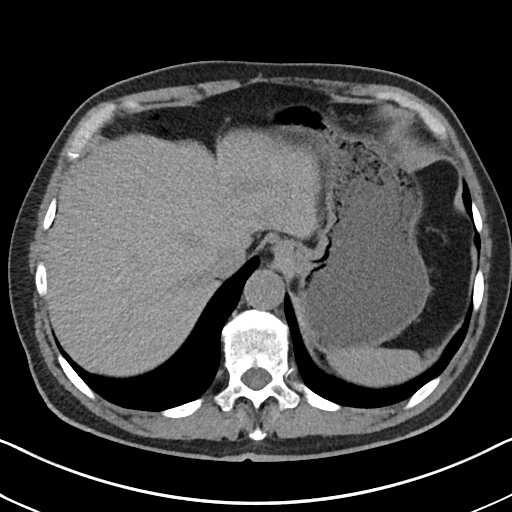
[im 79/91  lung]
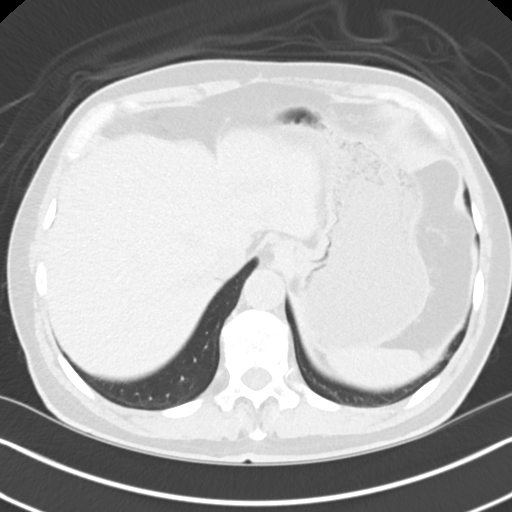

[Series 5: cor without without pre 2.00 cor · coronal · non-contrast · 0.69mm/px · 2 of 149 slices shown, 3 images]
[im 50/149  soft-tissue]
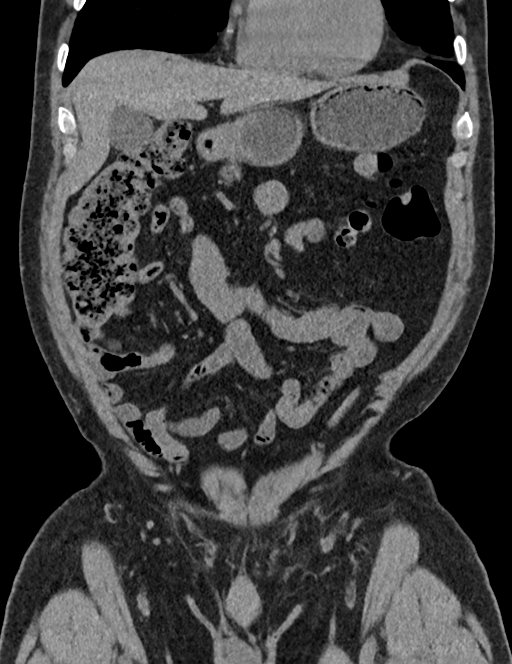
[im 50/149  bone]
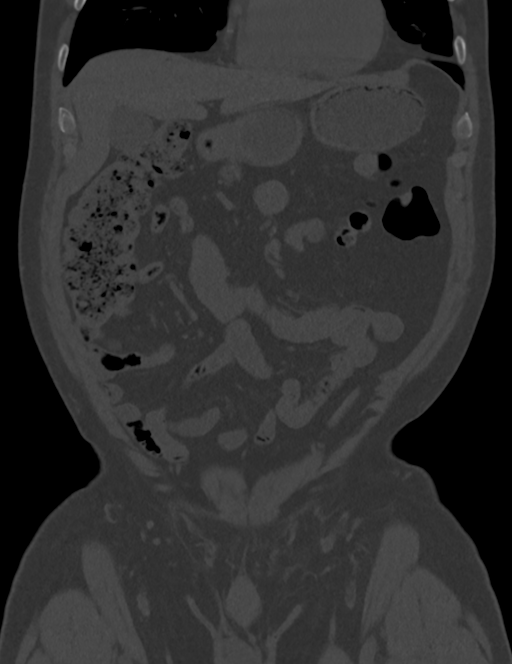
[im 99/149  soft-tissue]
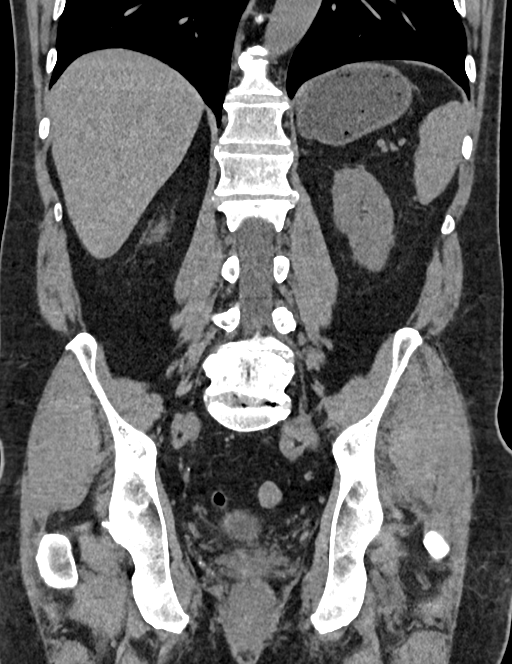

[9 of 46 positions shown; findings below may reference images not displayed]

RADIATION DOSE REDUCTION: This exam was performed according to the
departmental dose-optimization program which includes automated
exposure control, adjustment of the mA and/or kV according to
patient size and/or use of iterative reconstruction technique.

CONTRAST:  100mL OMNIPAQUE IOHEXOL 350 MG/ML SOLN
FINDINGS: Lower chest: No acute abnormality.

Hepatobiliary: No solid liver abnormality is seen. No gallstones,
gallbladder wall thickening, or biliary dilatation.

Pancreas: Unremarkable. No pancreatic ductal dilatation or
surrounding inflammatory changes.

Spleen: Normal in size without significant abnormality.

Adrenals/Urinary Tract: Adrenal glands are unremarkable. Small
nonobstructive calculus of the inferior pole of left kidney (series
5, image 82). No right-sided calculi. Bilateral parapelvic cysts. No
hydronephrosis. Bladder is unremarkable.

Stomach/Bowel: Stomach is within normal limits. Appendix appears
normal. No evidence of bowel wall thickening, distention, or
inflammatory changes. Sigmoid diverticula.

Vascular/Lymphatic: Aortic atherosclerosis. No enlarged abdominal or
pelvic lymph nodes.

Reproductive: Prostatomegaly with median lobe hypertrophy.

Other: Fat containing left inguinal hernia.  No ascites.

Musculoskeletal: No acute or significant osseous findings.
IMPRESSION: 1. Small nonobstructive calculus of the inferior pole of left
kidney. No right-sided calculi. No hydronephrosis.
2. No urinary tract mass or filling defect on delayed phase imaging.
3. Prostatomegaly.
4. Fat containing left inguinal hernia.

Aortic Atherosclerosis (HVPKU-ZTX.X).

ADDENDUM:
No follow-up imaging is recommended for benign parapelvic cysts
noted in the body of report.

*** End of Addendum ***
RADIATION DOSE REDUCTION: This exam was performed according to the
departmental dose-optimization program which includes automated
exposure control, adjustment of the mA and/or kV according to
patient size and/or use of iterative reconstruction technique.

CONTRAST:  100mL OMNIPAQUE IOHEXOL 350 MG/ML SOLN
FINDINGS: Lower chest: No acute abnormality.

Hepatobiliary: No solid liver abnormality is seen. No gallstones,
gallbladder wall thickening, or biliary dilatation.

Pancreas: Unremarkable. No pancreatic ductal dilatation or
surrounding inflammatory changes.

Spleen: Normal in size without significant abnormality.

Adrenals/Urinary Tract: Adrenal glands are unremarkable. Small
nonobstructive calculus of the inferior pole of left kidney (series
5, image 82). No right-sided calculi. Bilateral parapelvic cysts. No
hydronephrosis. Bladder is unremarkable.

Stomach/Bowel: Stomach is within normal limits. Appendix appears
normal. No evidence of bowel wall thickening, distention, or
inflammatory changes. Sigmoid diverticula.

Vascular/Lymphatic: Aortic atherosclerosis. No enlarged abdominal or
pelvic lymph nodes.

Reproductive: Prostatomegaly with median lobe hypertrophy.

Other: Fat containing left inguinal hernia.  No ascites.

Musculoskeletal: No acute or significant osseous findings.
IMPRESSION: 1. Small nonobstructive calculus of the inferior pole of left
kidney. No right-sided calculi. No hydronephrosis.
2. No urinary tract mass or filling defect on delayed phase imaging.
3. Prostatomegaly.
4. Fat containing left inguinal hernia.

Aortic Atherosclerosis (HVPKU-ZTX.X).

## 2024-01-08 NOTE — Telephone Encounter (Signed)
 Pt's pcp did NOT do a PSA and he would like to have it done here prior to his appointment on 2/22.  Please advise.

## 2024-02-22 ENCOUNTER — Other Ambulatory Visit

## 2024-02-29 ENCOUNTER — Ambulatory Visit: Payer: Medicare PPO | Admitting: Urology
